# Patient Record
Sex: Female | Born: 1975 | Hispanic: No | Marital: Married | Smoking: Never smoker
Health system: Southern US, Community
[De-identification: ages and names within clinical notes are randomized; demographics above are authoritative.]

## PROBLEM LIST (undated history)

## (undated) DIAGNOSIS — G43909 Migraine, unspecified, not intractable, without status migrainosus: Secondary | ICD-10-CM

## (undated) DIAGNOSIS — E079 Disorder of thyroid, unspecified: Secondary | ICD-10-CM

## (undated) HISTORY — DX: Disorder of thyroid, unspecified: E07.9

## (undated) HISTORY — DX: Migraine, unspecified, not intractable, without status migrainosus: G43.909

---

## 2016-05-11 DIAGNOSIS — G43009 Migraine without aura, not intractable, without status migrainosus: Secondary | ICD-10-CM | POA: Insufficient documentation

## 2018-03-02 DIAGNOSIS — E039 Hypothyroidism, unspecified: Secondary | ICD-10-CM | POA: Insufficient documentation

## 2018-03-02 DIAGNOSIS — J302 Other seasonal allergic rhinitis: Secondary | ICD-10-CM | POA: Insufficient documentation

## 2018-03-02 DIAGNOSIS — J3089 Other allergic rhinitis: Secondary | ICD-10-CM

## 2018-06-23 DIAGNOSIS — G43909 Migraine, unspecified, not intractable, without status migrainosus: Secondary | ICD-10-CM | POA: Insufficient documentation

## 2019-01-10 ENCOUNTER — Ambulatory Visit (INDEPENDENT_AMBULATORY_CARE_PROVIDER_SITE_OTHER): Payer: 59

## 2019-01-10 ENCOUNTER — Ambulatory Visit (INDEPENDENT_AMBULATORY_CARE_PROVIDER_SITE_OTHER): Payer: 59 | Admitting: Orthopaedic Surgery

## 2019-01-10 DIAGNOSIS — M25511 Pain in right shoulder: Secondary | ICD-10-CM

## 2019-01-10 DIAGNOSIS — M25521 Pain in right elbow: Secondary | ICD-10-CM | POA: Diagnosis not present

## 2019-01-10 MED ORDER — MELOXICAM 7.5 MG PO TABS: 7.5000 mg | ORAL_TABLET | Freq: Every day | ORAL | 2 refills | Status: DC | PRN

## 2019-01-10 MED ORDER — METHYLPREDNISOLONE 4 MG PO TBPK: ORAL_TABLET | ORAL | 0 refills | Status: DC

## 2019-01-10 MED FILL — METHYLPREDNISOLONE 4 MG TAB: 4 | 6 days supply | Qty: 21 | Fill #0

## 2019-01-10 MED FILL — MELOXICAM 7.5 MG TABLET: 7.5 | 30 days supply | Qty: 30 | Fill #0

## 2019-01-10 NOTE — Progress Notes (Signed)
Office Visit Note   Patient: Kristina Sherman           Date of Birth: 04/05/1976           MRN: [id number] Visit Date: 01/10/2019              Requested by: No referring provider defined for this encounter. PCP: System, Pcp Not In   Assessment & Plan: Visit Diagnoses:  1. Pain in right elbow   2. Trigger point of shoulder region, right     Plan: Impression is right scapular trigger point and overuse of the right arm from bowling.  She may have some tendinitis or arthritis from her recent bowling.  She has taken ibuprofen but this gives her headache.  I have given her a prescription for a Medrol Dosepak and meloxicam to try.  I demonstrated exercises for the scapular trigger point.  I also gave her a sample of Flector patches.  Questions encouraged and answered.  Follow-Up Instructions: Return if symptoms worsen or fail to improve.   Orders:  Orders Placed This Encounter  Procedures  . XR Elbow Complete Right (3+View)   Meds ordered this encounter  Medications  . methylPREDNISolone (MEDROL DOSEPAK) 4 MG TBPK tablet    Sig: Take as directed    Dispense:  21 tablet    Refill:  0  . meloxicam (MOBIC) 7.5 MG tablet    Sig: Take 1 tablet (7.5 mg total) by mouth daily as needed for pain.    Dispense:  30 tablet    Refill:  2      Procedures: No procedures performed   Clinical Data: No additional findings.   Subjective: Chief Complaint  Patient presents with  . Right Hand - Pain    Patient is a very pleasant 43 year old female comes in with 10 to 12 days of nonspecific right elbow pain and right posterior shoulder pain.  She denies any injuries she states that she did do about 2 hours of bowling recently which may have caused this.  She denies any weakness or numbness but she does endorse some occasional tingling.  She denies any history of gout or injuries.  Denies any neck pain.   Review of Systems  Constitutional: Negative.   HENT: Negative.   Eyes: Negative.     Respiratory: Negative.   Cardiovascular: Negative.   Endocrine: Negative.   Musculoskeletal: Negative.   Neurological: Negative.   Hematological: Negative.   Psychiatric/Behavioral: Negative.   All other systems reviewed and are negative.    Objective: Vital Signs: There were no vitals taken for this visit.  Physical Exam Vitals signs and nursing note reviewed.  Constitutional:      Appearance: She is well-developed.  HENT:     Head: Normocephalic and atraumatic.  Neck:     Musculoskeletal: Neck supple.  Pulmonary:     Effort: Pulmonary effort is normal.  Abdominal:     Palpations: Abdomen is soft.  Skin:    General: Skin is warm.     Capillary Refill: Capillary refill takes less than 2 seconds.  Neurological:     Mental Status: She is alert and oriented to person, place, and time.  Psychiatric:        Behavior: Behavior normal.        Thought Content: Thought content normal.        Judgment: Judgment normal.     Ortho Exam Right shoulder exam shows tenderness along the rhomboids consistent with a scapular trigger point.  Right elbow exam shows full range of motion without significant pain with supination pronation.  She has some mild discomfort with elbow flexion and extension.  She has no point tenderness to palpation of the lateral or medial epicondyles. Specialty Comments:  No specialty comments available.  Imaging: Xr Elbow Complete Right (3+view)  Result Date: 01/10/2019 No acute or structural abnormalities.    PMFS History: There are no active problems to display for this patient.  No past medical history on file.  No family history on file.   Social History   Occupational History  . Not on file  Tobacco Use  . Smoking status: Not on file  Substance and Sexual Activity  . Alcohol use: Not on file  . Drug use: Not on file  . Sexual activity: Not on file

## 2019-01-11 ENCOUNTER — Ambulatory Visit: Payer: Self-pay | Admitting: Family Medicine

## 2019-01-13 ENCOUNTER — Ambulatory Visit: Payer: Self-pay | Admitting: Family Medicine

## 2019-01-18 ENCOUNTER — Encounter: Payer: Self-pay | Admitting: Neurology

## 2019-01-18 ENCOUNTER — Encounter: Payer: Self-pay | Admitting: Family Medicine

## 2019-01-18 ENCOUNTER — Ambulatory Visit (INDEPENDENT_AMBULATORY_CARE_PROVIDER_SITE_OTHER): Payer: 59 | Admitting: Family Medicine

## 2019-01-18 ENCOUNTER — Other Ambulatory Visit: Payer: Self-pay | Admitting: Family Medicine

## 2019-01-18 VITALS — BP 110/78 | HR 73 | Temp 98.1°F | Ht 62.0 in | Wt 121.6 lb

## 2019-01-18 DIAGNOSIS — E039 Hypothyroidism, unspecified: Secondary | ICD-10-CM | POA: Diagnosis not present

## 2019-01-18 DIAGNOSIS — Z124 Encounter for screening for malignant neoplasm of cervix: Secondary | ICD-10-CM

## 2019-01-18 DIAGNOSIS — G43709 Chronic migraine without aura, not intractable, without status migrainosus: Secondary | ICD-10-CM | POA: Diagnosis not present

## 2019-01-18 DIAGNOSIS — Z0000 Encounter for general adult medical examination without abnormal findings: Secondary | ICD-10-CM | POA: Diagnosis not present

## 2019-01-18 DIAGNOSIS — Z7689 Persons encountering health services in other specified circumstances: Secondary | ICD-10-CM | POA: Diagnosis not present

## 2019-01-18 DIAGNOSIS — E559 Vitamin D deficiency, unspecified: Secondary | ICD-10-CM

## 2019-01-18 DIAGNOSIS — Z1239 Encounter for other screening for malignant neoplasm of breast: Secondary | ICD-10-CM | POA: Diagnosis not present

## 2019-01-18 LAB — LIPID PANEL
CHOLESTEROL: 148 mg/dL (ref 0–200)
HDL: 54.5 mg/dL (ref >39.00)
LDL CALC: 83 mg/dL (ref 0–99)
NonHDL: 93.71
Total CHOL/HDL Ratio: 3
Triglycerides: 55 mg/dL (ref 0.0–149.0)
VLDL: 11 mg/dL (ref 0.0–40.0)

## 2019-01-18 LAB — BASIC METABOLIC PANEL
BUN: 15 mg/dL (ref 6–23)
CO2: 29 mEq/L (ref 19–32)
Calcium: 9.6 mg/dL (ref 8.4–10.5)
Chloride: 104 mEq/L (ref 96–112)
Creatinine, Ser: 0.81 mg/dL (ref 0.40–1.20)
GFR: 78.12 mL/min (ref >60.00)
Glucose, Bld: 82 mg/dL (ref 70–99)
Potassium: 5.3 mEq/L — ABNORMAL HIGH (ref 3.5–5.1)
Sodium: 141 mEq/L (ref 135–145)

## 2019-01-18 LAB — TSH: TSH: 2.91 u[IU]/mL (ref 0.35–4.50)

## 2019-01-18 LAB — AST: AST: 18 U/L (ref 0–37)

## 2019-01-18 LAB — VITAMIN D 25 HYDROXY (VIT D DEFICIENCY, FRACTURES): VITD: 12.75 ng/mL — ABNORMAL LOW (ref 30.00–100.00)

## 2019-01-18 LAB — ALT: ALT: 8 U/L (ref 0–35)

## 2019-01-18 LAB — T4, FREE: Free T4: 1.18 ng/dL (ref 0.60–1.60)

## 2019-01-18 MED ORDER — VITAMIN D (ERGOCALCIFEROL) 1.25 MG (50000 UNIT) PO CAPS: 50000.0000 [IU] | ORAL_CAPSULE | ORAL | 3 refills | Status: DC

## 2019-01-18 NOTE — Progress Notes (Signed)
Kristina Sherman is a 43 y.o. female  Chief Complaint  Patient presents with  . Establish Care    est care/ thyroid check, migraines 2 years/ pap 2018    HPI: Kristina Sherman is a 43 y.o. female here to establish care with our office. Her previous PCP was Dr. Burnett KanarisVirginia Sherman. She has a h/o migraines as well as hypothyroidism.  She is due for CPE and labs. She is not fasting - had tea, small bread with butter - but would like to do labs today.  She requests referrals to establish with GYN and Neuro.  Specialists: GYN (Dr. Evaristo BuryBarbara Eisenberg) - last OV 08/2018, neurology (Dr. Dalbert Batmanana Redmond) - last OV 07/2018, ortho (Dr. Roda ShuttersXu)  Last CPE, labs: 05/2017?  Last PAP: 12/2016 (normal PAP, HPV negative) - due in 2023 Last mammo: due Last Dexa: n/a Last colonoscopy: n/a  Med refills needed today: none  Patient Active Problem List   Diagnosis Date Noted  . Acquired hypothyroidism 03/02/2018  . Perennial allergic rhinitis with seasonal variation 03/02/2018  . Migraine without aura and without status migrainosus, not intractable 05/11/2016     History reviewed. No pertinent past medical history.  Past Surgical History:  Procedure Laterality Date  . CESAREAN SECTION      Social History   Socioeconomic History  . Marital status: Married    Spouse name: Not on file  . Number of children: Not on file  . Years of education: Not on file  . Highest education level: Not on file  Occupational History  . Not on file  Social Needs  . Financial resource strain: Not on file  . Food insecurity:    Worry: Not on file    Inability: Not on file  . Transportation needs:    Medical: Not on file    Non-medical: Not on file  Tobacco Use  . Smoking status: Never Smoker  . Smokeless tobacco: Never Used  Substance and Sexual Activity  . Alcohol use: Never    Frequency: Never  . Drug use: Never  . Sexual activity: Not on file  Lifestyle  . Physical activity:    Days per week: Not on file   Minutes per session: Not on file  . Stress: Not on file  Relationships  . Social connections:    Talks on phone: Not on file    Gets together: Not on file    Attends religious service: Not on file    Active member of club or organization: Not on file    Attends meetings of clubs or organizations: Not on file    Relationship status: Not on file  . Intimate partner violence:    Fear of current or ex partner: Not on file    Emotionally abused: Not on file    Physically abused: Not on file    Forced sexual activity: Not on file  Other Topics Concern  . Not on file  Social History Narrative  . Not on file    Family History  Problem Relation Age of Onset  . Hypertension Mother   . Diabetes Father      Immunization History  Administered Date(s) Administered  . Influenza-Unspecified 12/18/2016, 10/14/2017, 11/07/2018  . PPD Test 07/21/2017    Outpatient Encounter Medications as of 01/18/2019  Medication Sig  . levothyroxine (SYNTHROID, LEVOTHROID) 112 MCG tablet TAKE 1 TABLET BY MOUTH ONCE DAILY 6 IN THE MORNING  . meloxicam (MOBIC) 7.5 MG tablet Take 1 tablet (7.5 mg total) by mouth  daily as needed for pain.  . SUMAtriptan (IMITREX) 50 MG tablet Take 1 tablet (50 mg) at onset of headache and may repeat dose 2 hours later. Maximum 100 mg/day.  . topiramate (TOPAMAX) 50 MG tablet Take 1/2 tablet (25 mg) at bedtime x 1 week, then follow titration schedule as discussed in clinic.  . methylPREDNISolone (MEDROL DOSEPAK) 4 MG TBPK tablet Take as directed (Patient not taking: Reported on 01/18/2019)   No facility-administered encounter medications on file as of 01/18/2019.      ROS: Gen: no fever, chills  Skin: no rash, itching ENT: no ear pain, ear drainage, nasal congestion, rhinorrhea, sinus pressure, sore throat Eyes: no blurry vision, double vision Resp: no cough, wheeze,SOB CV: no CP, palpitations, LE edema,  GI: no heartburn, n/v/d/c, abd pain GU: no dysuria, urgency,  frequency, hematuria  MSK: no joint pain, myalgias, back pain Neuro: no dizziness, weakness, vertigo; + migraines Psych: no depression, anxiety, insomnia   No Known Allergies  BP 110/78   Pulse 73   Temp 98.1 F (36.7 C) (Oral)   Ht 5\' 2"  (1.575 m)   Wt 121 lb 9.6 oz (55.2 kg)   LMP 01/01/2019   SpO2 100%   BMI 22.24 kg/m   Physical Exam   A/P:  1. Encounter to establish care with new doctor  2. Screening for breast cancer - MM DIGITAL SCREENING BILATERAL; Future  3. Chronic migraine without aura without status migrainosus, not intractable - cont with current meds - Ambulatory referral to Neurology  4. Screening for cervical cancer - UTD on annual exam (08/2018) and PAP with HPV testing (12/2016) - Ambulatory referral to Gynecology  5. Annual physical exam - referral for mammo placed today, UTD on PAP - UTD on eye exam, due for dental exam - cont with healthy diet and encouraged pt to establish regular CV exercise routine - T4, free - ALT - AST - Basic metabolic panel - Lipid panel - VITAMIN D 25 Hydroxy (Vit-D Deficiency, Fractures) - next CPE in 1 year  6. Hypothyroidism, unspecified type - cont levothyroxine, pt will need refill once lab results are available/reviewed - TSH - T4, free

## 2019-01-19 ENCOUNTER — Other Ambulatory Visit: Payer: Self-pay | Admitting: Family Medicine

## 2019-01-19 ENCOUNTER — Telehealth: Payer: Self-pay | Admitting: Family Medicine

## 2019-01-19 MED ORDER — LEVOTHYROXINE SODIUM 112 MCG PO TABS: ORAL_TABLET | ORAL | 0 refills | Status: DC

## 2019-01-19 MED ORDER — SUMATRIPTAN SUCCINATE 50 MG PO TABS: ORAL_TABLET | ORAL | 0 refills | Status: DC

## 2019-01-19 NOTE — Telephone Encounter (Signed)
Attempted to contact pt; left message on voicemail [phone].

## 2019-01-19 NOTE — Telephone Encounter (Signed)
Copied from CRM [id number]. Topic: Quick Communication - Lab Results (Clinic Use ONLY) >> Jan 19, 2019  9:45 AM Prewette, Cordie Grice, LPN wrote: Called patient to inform them of 01/18/2019 lab results. When patient returns call, triage nurse may disclose results. >> Jan 19, 2019 10:03 AM Herby Abraham C wrote: Pt is returning call for results.

## 2019-01-19 NOTE — Telephone Encounter (Signed)
Copied from CRM [id number]. Topic: Quick Communication - Rx Refill/Question >> Jan 19, 2019 11:09 AM Baldo Daub L wrote: Medication:  levothyroxine (SYNTHROID, LEVOTHROID) 112 MCG tablet SUMAtriptan (IMITREX) 50 MG tablet  Has the patient contacted their pharmacy? Yes - needs refills (Agent: If no, request that the patient contact the pharmacy for the refill.) (Agent: If yes, when and what did the pharmacy advise?)  Preferred Pharmacy (with phone number or street name): [facility] - [place], [place] - [address]. [phone] (Phone) [phone] (Fax)  Agent: Please be advised that RX refills may take up to 3 business days. We ask that you follow-up with your pharmacy.

## 2019-01-19 NOTE — Telephone Encounter (Signed)
Approved per protocol.  

## 2019-01-27 MED FILL — LEVOTHYROXINE 112 MCG TAB: 112 | 90 days supply | Qty: 90 | Fill #0

## 2019-01-27 MED FILL — SUMAtriptan SUCCINATE 50 MG: 50 | 30 days supply | Qty: 10 | Fill #0

## 2019-01-27 MED FILL — VIT D2 1.25 MG (50,000 UNIT: 1.25 MG | 28 days supply | Qty: 4 | Fill #0

## 2019-02-08 ENCOUNTER — Encounter (HOSPITAL_BASED_OUTPATIENT_CLINIC_OR_DEPARTMENT_OTHER): Payer: Self-pay

## 2019-02-08 ENCOUNTER — Ambulatory Visit (HOSPITAL_BASED_OUTPATIENT_CLINIC_OR_DEPARTMENT_OTHER)
Admission: RE | Admit: 2019-02-08 | Discharge: 2019-02-08 | Disposition: A | Discharge status: Home / Self Care | Payer: 59 | Source: Ambulatory Visit | Attending: Family Medicine | Admitting: Family Medicine

## 2019-02-08 DIAGNOSIS — Z1239 Encounter for other screening for malignant neoplasm of breast: Secondary | ICD-10-CM

## 2019-02-08 DIAGNOSIS — Z1231 Encounter for screening mammogram for malignant neoplasm of breast: Secondary | ICD-10-CM | POA: Insufficient documentation

## 2019-03-02 ENCOUNTER — Ambulatory Visit: Payer: 59 | Admitting: Obstetrics & Gynecology

## 2019-03-19 NOTE — Progress Notes (Signed)
Virtual Visit via Video Note The purpose of this virtual visit is to provide medical care while limiting exposure to the novel coronavirus.    Consent was obtained for video visit:  yes Answered questions that patient had about telehealth interaction:  yes I discussed the limitations, risks, security and privacy concerns of performing an evaluation and management service by telemedicine. I also discussed with the patient that there may be a patient responsible charge related to this service. The patient expressed understanding and agreed to proceed.  Pt location: Home Physician Location: office Name of referring provider:  Overton MamCirigliano, Mary K, DO I connected with Kristina Sherman at patients initiation/request on 03/20/2019 at 10:00 AM EDT by video enabled telemedicine application and verified that I am speaking with the correct person using two identifiers. Pt MRN:  [id number] Pt DOB:  06-17-1976 Video Participants:  Kristina DykesLaxmi Gautier   History of Present Illness:  Kristina DykesLaxmi Macpherson is a 43 year old woman who presents for migraines.  History supplemented by PCP and prior neurologist's notes.  Onset:  Childhood.  More frequent since around 2015-2016 Location:  left sided from behind left eye to back of head, sometimes right side Quality:  Stabbing needle Intensity:  7/10.  She denies new headache, thunderclap headache Aura:  no Premonitory Phase:  no Postdrome:  no Associated symptoms:  When severe there is associated nausea and vomiting.  She denies associated photophobia, phonophobia, visual disturbance, unilateral numbness or weakness. Duration:  2 to 5 days. Frequency:  On average it occurs every 6 weeks.  When it occurs, she often will wake up in the middle of the night with it. Frequency of abortive medication: 2 to 5 days every 6 weeks Triggers:  Sleep deprivation, jumping, loud talking Relieving factors:  no Activity:  aggravates  Current NSAIDS:  none Current analgesics:  none Current  triptans:  Sumatriptan 50mg  (often waits 2 to 3 hours before taking) Current ergotamine:  none Current anti-emetic:  none Current muscle relaxants:  none Current anti-anxiolytic:  none Current sleep aide:  none Current Antihypertensive medications:  none Current Antidepressant medications:  none Current Anticonvulsant medications:  topiramate 50mg  twice daily Current anti-CGRP:  none Current Vitamins/Herbal/Supplements:  D Current Antihistamines/Decongestants:  none Other therapy:  none Hormone/birth control:  none Other medications:  Synthroid  Past NSAIDS:  Mobic Past analgesics:  Excedrin Past abortive triptans:  none Past abortive ergotamine:  none Past muscle relaxants:  none Past anti-emetic:  Promethazine 25mg  Past antihypertensive medications:  none Past antidepressant medications:  none Past anticonvulsant medications:  none Past anti-CGRP:  none Past vitamins/Herbal/Supplements:  none Past antihistamines/decongestants:  none Other past therapies:  none  Caffeine:  2 to 4 cups of tea daily Diet:  2-3 16 oz bottles of water daily Exercise:  yes Depression:  no; Anxiety:  no Other pain:  no Sleep hygiene:  varies Family history of headache:  no  01/18/19 LABS:  Na 141, K 5.3, Cl 104, CO2 29, glucose 82, BUN 15, Cr 0.81, ALT 8, AST 18.  Past Medical History: Past Medical History:  Diagnosis Date  . Migraines   . Thyroid disease    Past Surgical History: Past Surgical History:  Procedure Laterality Date  . CESAREAN SECTION     Medications: Outpatient Encounter Medications as of 03/20/2019  Medication Sig  . levothyroxine (SYNTHROID, LEVOTHROID) 112 MCG tablet TAKE 1 TABLET BY MOUTH ONCE DAILY 6 IN THE MORNING  . meloxicam (MOBIC) 7.5 MG tablet Take 1 tablet (7.5 mg total)  by mouth daily as needed for pain.  . methylPREDNISolone (MEDROL DOSEPAK) 4 MG TBPK tablet Take as directed (Patient not taking: Reported on 01/18/2019)  . SUMAtriptan (IMITREX) 50 MG  tablet Take 1 tablet (50 mg) at onset of headache and may repeat dose 2 hours later. Maximum 100 mg/day.  . topiramate (TOPAMAX) 50 MG tablet Take 1/2 tablet (25 mg) at bedtime x 1 week, then follow titration schedule as discussed in clinic.  . Vitamin D, Ergocalciferol, (DRISDOL) 1.25 MG (50000 UT) CAPS capsule Take 1 capsule (50,000 Units total) by mouth every 7 (seven) days.   No facility-administered encounter medications on file as of 03/20/2019.     Allergies: No Known Allergies  Family History: Family History  Problem Relation Age of Onset  . Hypertension Mother   . Diabetes Father     Social History: Social History   Socioeconomic History  . Marital status: Married    Spouse name: Not on file  . Number of children: Not on file  . Years of education: Not on file  . Highest education level: Not on file  Occupational History  . Not on file  Social Needs  . Financial resource strain: Not on file  . Food insecurity:    Worry: Not on file    Inability: Not on file  . Transportation needs:    Medical: Not on file    Non-medical: Not on file  Tobacco Use  . Smoking status: Never Smoker  . Smokeless tobacco: Never Used  Substance and Sexual Activity  . Alcohol use: Never    Frequency: Never  . Drug use: Never  . Sexual activity: Not on file  Lifestyle  . Physical activity:    Days per week: Not on file    Minutes per session: Not on file  . Stress: Not on file  Relationships  . Social connections:    Talks on phone: Not on file    Gets together: Not on file    Attends religious service: Not on file    Active member of club or organization: Not on file    Attends meetings of clubs or organizations: Not on file    Relationship status: Not on file  . Intimate partner violence:    Fear of current or ex partner: Not on file    Emotionally abused: Not on file    Physically abused: Not on file    Forced sexual activity: Not on file  Other Topics Concern  . Not on  file  Social History Narrative  . Not on file    Review Of Systems: Review of Systems  Constitutional: Negative for chills, fever and malaise/fatigue.  HENT: Negative for congestion, ear discharge, ear pain, hearing loss, nosebleeds, sinus pain, sore throat and tinnitus.   Eyes: Negative for blurred vision, photophobia, pain, discharge and redness.  Respiratory: Negative for cough, sputum production, shortness of breath and wheezing.   Cardiovascular: Negative for chest pain, palpitations and orthopnea.  Gastrointestinal: Negative for abdominal pain, constipation, diarrhea, heartburn, nausea and vomiting.  Genitourinary: Negative for dysuria.  Musculoskeletal: Negative for myalgias.  Skin: Negative for itching and rash.  Neurological: Positive for headaches. Negative for tremors, sensory change, speech change and focal weakness.  Endo/Heme/Allergies: Negative for environmental allergies. Does not bruise/bleed easily.  Psychiatric/Behavioral: Negative for depression. The patient is not nervous/anxious.     Observations/Objective:   Height  (1.575 m), weight 120 lb (54.4 kg). alert and oriented to person, place, and time. Attention  span and concentration intact, recent and remote memory intact, fund of knowledge intact.  Speech fluent and not dysarthric, language intact.  Eyes orthophoric and move in all directions.  Face symmetric.  Assessment and Plan:   Migraine without aura, without status migrainosus, intractable  1.  For preventative management, refilled topiramate 50mg  twice daily 2.  For abortive therapy, take sumatriptan 100mg  with naproxen 500mg  (especially take naproxen with sumatriptan if wake up at night with headache).  Take earliest onset of migraine 3. Take naproxen 500mg  30 minutes prior to exercise to prevent onset of headache while exercising. 4.  Limit use of pain relievers to no more than 2 days out of week to prevent risk of rebound or medication-overuse  headache. 5.  Keep headache diary 6.  Exercise, hydration, caffeine cessation, sleep hygiene, monitor for and avoid triggers 7.  Consider:  magnesium citrate 400mg  daily, riboflavin 400mg  daily, and coenzyme Q10 100mg  three times daily 8.  Follow up in 4 months  Follow Up Instructions:    -I discussed the assessment and treatment plan with the patient. The patient was provided an opportunity to ask questions and all were answered. The patient agreed with the plan and demonstrated an understanding of the instructions.   The patient was advised to call back or seek an in-person evaluation if the symptoms worsen or if the condition fails to improve as anticipated.   Cira Servant, DO

## 2019-03-20 ENCOUNTER — Encounter: Payer: Self-pay | Admitting: Neurology

## 2019-03-20 ENCOUNTER — Telehealth (INDEPENDENT_AMBULATORY_CARE_PROVIDER_SITE_OTHER): Payer: 59 | Admitting: Neurology

## 2019-03-20 ENCOUNTER — Ambulatory Visit: Payer: 59 | Admitting: Neurology

## 2019-03-20 ENCOUNTER — Other Ambulatory Visit: Payer: Self-pay

## 2019-03-20 ENCOUNTER — Encounter

## 2019-03-20 VITALS — Ht 62.0 in | Wt 120.0 lb

## 2019-03-20 DIAGNOSIS — G43019 Migraine without aura, intractable, without status migrainosus: Secondary | ICD-10-CM | POA: Diagnosis not present

## 2019-03-20 MED ORDER — TOPIRAMATE 50 MG PO TABS: 50.0000 mg | ORAL_TABLET | Freq: Two times a day (BID) | ORAL | 3 refills | Status: DC

## 2019-03-20 MED ORDER — SUMATRIPTAN SUCCINATE 100 MG PO TABS: ORAL_TABLET | ORAL | 2 refills | Status: DC

## 2019-03-20 MED ORDER — NAPROXEN 500 MG PO TABS: 500.0000 mg | ORAL_TABLET | Freq: Two times a day (BID) | ORAL | 3 refills | Status: DC | PRN

## 2019-03-20 MED FILL — SUMATRIPTAN SUCC 100 MG TAB: 100 | 30 days supply | Qty: 9 | Fill #0

## 2019-03-20 MED FILL — TOPIRAMATE 50 MG TABLET: 50 | 30 days supply | Qty: 60 | Fill #0

## 2019-03-20 MED FILL — NAPROXEN 500 MG TABLET: 500 | 10 days supply | Qty: 20 | Fill #0

## 2019-03-20 NOTE — Patient Instructions (Signed)
1.  refilled topiramate 50mg  twice daily 2.  when you get a migraine, take sumatriptan 100mg  with naproxen 500mg  (especially take naproxen with sumatriptan if wake up at night with headache).  Take earliest onset of migraine 3. Take naproxen 500mg  30 minutes prior to exercise to prevent onset of headache while exercising. 4.  Limit use of pain relievers to no more than 2 days out of week to prevent risk of rebound or medication-overuse headache. 5.  Keep headache diary 6.  Exercise, hydration, caffeine cessation, sleep hygiene, monitor for and avoid triggers 7.  Consider:  magnesium citrate 400mg  daily, riboflavin 400mg  daily, and coenzyme Q10 100mg  three times daily 8.  Follow up in 4 months

## 2019-03-20 NOTE — Addendum Note (Signed)
Addended byEverlena Cooper, Jeramia Saleeby R on: 03/20/2019 10:43 AM   Modules accepted: Orders

## 2019-03-22 ENCOUNTER — Ambulatory Visit: Payer: 59 | Admitting: Obstetrics & Gynecology

## 2019-04-03 ENCOUNTER — Ambulatory Visit: Payer: 59 | Admitting: Neurology

## 2019-04-03 ENCOUNTER — Ambulatory Visit: Payer: 59 | Admitting: Obstetrics & Gynecology

## 2019-04-14 ENCOUNTER — Other Ambulatory Visit: Payer: Self-pay

## 2019-04-17 ENCOUNTER — Ambulatory Visit: Payer: 59 | Admitting: Obstetrics & Gynecology

## 2019-05-04 ENCOUNTER — Other Ambulatory Visit: Payer: Self-pay | Admitting: Family Medicine

## 2019-05-04 MED ORDER — SUMATRIPTAN SUCCINATE 100 MG PO TABS: ORAL_TABLET | ORAL | 2 refills | Status: DC

## 2019-05-04 MED ORDER — LEVOTHYROXINE SODIUM 112 MCG PO TABS: ORAL_TABLET | ORAL | 2 refills | Status: DC

## 2019-05-04 MED FILL — SUMAtriptan SUCCINATE 100 M: 100 | 30 days supply | Qty: 9 | Fill #0

## 2019-05-04 MED FILL — LEVOTHYROXINE 112 MCG TAB: 112 | 90 days supply | Qty: 90 | Fill #0

## 2019-05-04 NOTE — Telephone Encounter (Signed)
Dr. Salena Saner please advise is it okay to refill imitrex?

## 2019-05-04 NOTE — Telephone Encounter (Signed)
Copied from CRM [id number]. Topic: Quick Communication - Rx Refill/Question >> May 04, 2019  9:58 AM Tamela Oddi wrote: Medication: levothyroxine (SYNTHROID, LEVOTHROID) 112 MCG tablet / SUMAtriptan (IMITREX) 100 MG tablet  Patient called to request a refill for the above medication  Preferred Pharmacy (with phone number or street name): [facility] - [place], [place] - [address] [place]. [phone] (Phone) [phone] (Fax)

## 2019-07-19 NOTE — Progress Notes (Signed)
Virtual Visit via Video Note The purpose of this virtual visit is to provide medical care while limiting exposure to the novel coronavirus.    Consent was obtained for video visit:  Yes.   Answered questions that patient had about telehealth interaction:  Yes.   I discussed the limitations, risks, security and privacy concerns of performing an evaluation and management service by telemedicine. I also discussed with the patient that there may be a patient responsible charge related to this service. The patient expressed understanding and agreed to proceed.  Pt location: Home Physician Location: Home Name of referring provider:  [place] MamCirigliano, Kristina K, DO I connected with Kristina Sherman at patients initiation/request on 07/20/2019 at 10:10 AM EDT by video enabled telemedicine application and verified that I am speaking with the correct person using two identifiers. Pt MRN:  [id number] Pt DOB:  1976/07/15 Video Participants:  Kristina Sherman   History of Present Illness:  Kristina DykesLaxmi Sherman is a 43 year old woman who follows up for migraines.  UPDATE: Overall well.  She had increased frequency of headaches in June due to a guest staying over, which affected her sleep hygiene.  Since June, she has had one migraine. Intensity:  7/10 Duration:  Within an hour Frequency:  Since June, only one migraine (aggravated by sleep deprivation) Frequency of abortive medication: once since June Rescue protocol:  Sumatriptan 100mg  with naproxen 500mg ; naproxen 500mg  30 minutes prior to exercise to prevent onset of exercise-induced migraine. Current NSAIDS:  naproxen 500mg  Current analgesics:  none Current triptans:  Sumatriptan 100mg  Current ergotamine:  none Current anti-emetic:  none Current muscle relaxants:  none Current anti-anxiolytic:  none Current sleep aide:  none Current Antihypertensive medications:  none Current Antidepressant medications:  none Current Anticonvulsant medications:  topiramate  50mg  twice daily Current anti-CGRP:  none Current Vitamins/Herbal/Supplements:  D Current Antihistamines/Decongestants:  none Other therapy:  none Hormone/birth control:  none Other medications:  Synthroid  Caffeine:  2 to 4 cups of tea daily Diet:  2-3 16 oz bottles of water daily Exercise:  yes Depression:  no; Anxiety:  no Other pain:  no Sleep hygiene:  varies  HISTORY: Onset: Since childhood.  More frequent since around 2015-2016 Location:  left sided from behind left eye to back of head, sometimes right side Quality:  Stabbing needle Initial intensity:  7/10.  She denies new headache, thunderclap headache Aura:  no Premonitory Phase:  no Postdrome:  no Associated symptoms:  When severe there is associated nausea and vomiting.  She denies associated photophobia, phonophobia, visual disturbance, unilateral numbness or weakness. Initial duration:  2 to 5 days. Initial Frequency:  On average it occurs every 6 weeks.  When it occurs, she often will wake up in the middle of the night with it. Initial Frequency of abortive medication: 2 to 5 days every 6 weeks Triggers: Sleep deprivation, jumping, loud talking Relieving factors: None Activity:  aggravates  Past NSAIDS:  Mobic Past analgesics:  Excedrin Past abortive triptans:  none Past abortive ergotamine:  none Past muscle relaxants:  none Past anti-emetic:  Promethazine 25mg  Past antihypertensive medications:  none Past antidepressant medications:  none Past anticonvulsant medications:  none Past anti-CGRP:  none Past vitamins/Herbal/Supplements:  none Past antihistamines/decongestants:  none Other past therapies:  none  Family history of headache:  no  Past Medical History: Past Medical History:  Diagnosis Date   Migraines    Thyroid disease     Medications: Outpatient Encounter Medications as of 07/20/2019  Medication Sig   levothyroxine (SYNTHROID) 112 MCG tablet TAKE 1 TABLET BY MOUTH ONCE DAILY 6 IN  THE MORNING   meloxicam (MOBIC) 7.5 MG tablet Take 1 tablet (7.5 mg total) by mouth daily as needed for pain. (Patient not taking: Reported on 03/20/2019)   methylPREDNISolone (MEDROL DOSEPAK) 4 MG TBPK tablet Take as directed (Patient not taking: Reported on 01/18/2019)   naproxen (NAPROSYN) 500 MG tablet Take 1 tablet (500 mg total) by mouth every 12 (twelve) hours as needed.   SUMAtriptan (IMITREX) 100 MG tablet Take 1 tab earliest onset of migraine.  May repeat in 2 hours if headache persists or recurs. Max 2 tabs/24 hrs   topiramate (TOPAMAX) 50 MG tablet Take 1 tablet (50 mg total) by mouth 2 (two) times daily.   Vitamin D, Ergocalciferol, (DRISDOL) 1.25 MG (50000 UT) CAPS capsule Take 1 capsule (50,000 Units total) by mouth every 7 (seven) days.   No facility-administered encounter medications on file as of 07/20/2019.     Allergies: No Known Allergies  Family History: Family History  Problem Relation Age of Onset   Hypertension Mother    Diabetes Father     Social History: Social History   Socioeconomic History   Marital status: Married    Spouse name: Not on file   Number of children: Not on file   Years of education: Not on file   Highest education level: Not on file  Occupational History   Not on file  Social Needs   Financial resource strain: Not on file   Food insecurity    Worry: Not on file    Inability: Not on file   Transportation needs    Medical: Not on file    Non-medical: Not on file  Tobacco Use   Smoking status: Never Smoker   Smokeless tobacco: Never Used  Substance and Sexual Activity   Alcohol use: Never    Frequency: Never   Drug use: Never   Sexual activity: Not on file  Lifestyle   Physical activity    Days per week: Not on file    Minutes per session: Not on file   Stress: Not on file  Relationships   Social connections    Talks on phone: Not on file    Gets together: Not on file    Attends religious service:  Not on file    Active member of club or organization: Not on file    Attends meetings of clubs or organizations: Not on file    Relationship status: Not on file   Intimate partner violence    Fear of current or ex partner: Not on file    Emotionally abused: Not on file    Physically abused: Not on file    Forced sexual activity: Not on file  Other Topics Concern   Not on file  Social History Narrative   Not on file    Observations/Objective:   Height 5\' 2"  (1.575 m), weight 117 lb (53.1 kg). No acute distress.  Alert and oriented.  Speech fluent and not dysarthric.  Language intact.  Eyes orthophoric on primary gaze.  Face symmetric.  Assessment and Plan:   Migraine without aura, without status migrainosus, not intractable.  1.  For preventative management, topiramate 50mg  twice daily 2.  For abortive therapy, sumatriptan 100mg  (w/wo naproxen).  Also naproxen 500mg  taken 30 minutes prior to exercise. 3.  Limit use of pain relievers to no more than 2 days out of week to prevent  risk of rebound or medication-overuse headache. 4.  Keep headache diary 5.  Exercise, hydration, caffeine cessation, sleep hygiene, monitor for and avoid triggers 6.  Consider:  magnesium citrate 400mg  daily, riboflavin 400mg  daily, and coenzyme Q10 100mg  three times daily 7. Always keep in mind that currently taking a hormone or birth control may be a possible trigger or aggravating factor for migraine. 8. Follow up 6 months.  Follow Up Instructions:    -I discussed the assessment and treatment plan with the patient. The patient was provided an opportunity to ask questions and all were answered. The patient agreed with the plan and demonstrated an understanding of the instructions.   The patient was advised to call back or seek an in-person evaluation if the symptoms worsen or if the condition fails to improve as anticipated.    Cira ServantAdam Robert Chevi Lim, DO

## 2019-07-20 ENCOUNTER — Encounter: Payer: Self-pay | Admitting: Neurology

## 2019-07-20 ENCOUNTER — Other Ambulatory Visit: Payer: Self-pay

## 2019-07-20 ENCOUNTER — Telehealth (INDEPENDENT_AMBULATORY_CARE_PROVIDER_SITE_OTHER): Payer: 59 | Admitting: Neurology

## 2019-07-20 VITALS — Ht 62.0 in | Wt 117.0 lb

## 2019-07-20 DIAGNOSIS — G43009 Migraine without aura, not intractable, without status migrainosus: Secondary | ICD-10-CM

## 2019-07-20 MED ORDER — SUMATRIPTAN SUCCINATE 100 MG PO TABS: ORAL_TABLET | ORAL | 5 refills | Status: DC

## 2019-07-20 MED FILL — SUMAtriptan SUCCINATE 100 M: 100 | 30 days supply | Qty: 9 | Fill #0

## 2019-08-04 MED FILL — LEVOTHYROXINE 112 MCG TAB: 112 | 90 days supply | Qty: 90 | Fill #1

## 2019-10-04 MED FILL — SUMAtriptan SUCCINATE 100 M: 100 | 30 days supply | Qty: 9 | Fill #1

## 2019-11-09 MED FILL — LEVOTHYROXINE 112 MCG TAB: 112 | 90 days supply | Qty: 90 | Fill #2

## 2019-11-22 MED FILL — SUMAtriptan SUCCINATE 100 M: 100 | 30 days supply | Qty: 9 | Fill #2

## 2019-12-28 MED FILL — SUMAtriptan SUCCINATE 100 M: 100 | 30 days supply | Qty: 9 | Fill #3

## 2020-01-18 NOTE — Progress Notes (Signed)
Virtual Visit via Video Note The purpose of this virtual visit is to provide medical care while limiting exposure to the novel coronavirus.    Consent was obtained for video visit:  Yes.   Answered questions that patient had about telehealth interaction:  Yes.   I discussed the limitations, risks, security and privacy concerns of performing an evaluation and management service by telemedicine. I also discussed with the patient that there may be a patient responsible charge related to this service. The patient expressed understanding and agreed to proceed.  Pt location: Home Physician Location: office Name of referring provider:  Ronnald Nian, DO I connected with Kristina Sherman at patients initiation/request on 01/22/2020 at 10:50 AM EST by video enabled telemedicine application and verified that I am speaking with the correct person using two identifiers. Pt MRN:  [id number] Pt DOB:  [birth year] Video Participants:  Kristina Sherman   History of Present Illness:  Kristina Sherman is a 44 year old woman who follows up for migraines.  UPDATE: Headache frequency has been fluctuating.  A couple of days ago, she had a migraine that caused her to vomit.  She stopped topiramate 3 days ago because it causes dizziness and memory problems. Intensity:  7/10 Duration:  Within an hour Frequency:  7-8 in past 30 days Rescue protocol:  Sumatriptan 100mg  Current NSAIDS:none Current analgesics:none Current triptans:Sumatriptan 100mg  Current ergotamine:none Current anti-emetic:none Current muscle relaxants:none Current anti-anxiolytic:none Current sleep aide:none Current Antihypertensive medications:none Current Antidepressant medications:none Current Anticonvulsant medications:none Current anti-CGRP:none Current Vitamins/Herbal/Supplements:D Current Antihistamines/Decongestants:none Other therapy:none Hormone/birth control:none Other  medications:Synthroid  Caffeine:2 to 4 cups of tea daily Diet:2-3 16 oz bottles of water daily Exercise:yes Depression:no; Anxiety:no Other pain:no Sleep hygiene:varies  HISTORY: Onset: Since childhood. More frequent since around 2015-2016 Location:left sided from behind left eye to back of head, sometimes right side Quality:Stabbing needle Initial intensity:7/10.Shedenies new headache, thunderclap headache Aura:no Premonitory Phase:no Postdrome:no Associated symptoms:When severe there is associated nausea and vomiting.Shedenies associated photophobia, phonophobia, visual disturbance,unilateral numbness or weakness. Initial duration:2 to 5 days. Initial Frequency:On average it occurs every 6 weeks. When it occurs, she often will wake up in the middle of the night with it. Initial Frequency of abortive medication:2 to 5 days every 6 weeks Triggers: Sleep deprivation, jumping, loud talking Relieving factors: None Activity:aggravates  Past NSAIDS:Mobic Past analgesics:Excedrin Past abortive triptans:none Past abortive ergotamine:none Past muscle relaxants:none Past anti-emetic:Promethazine 25mg  Past antihypertensive medications:none Past antidepressant medications:none Past anticonvulsant medications:  topiramate 50mg  twice daily (dizziness, memory problems) Past anti-CGRP:none Past vitamins/Herbal/Supplements:none Past antihistamines/decongestants:none Other past therapies:none  Family history of headache:no  Past Medical History: Past Medical History:  Diagnosis Date  . Migraines   . Thyroid disease     Medications: Outpatient Encounter Medications as of 01/22/2020  Medication Sig  . levothyroxine (SYNTHROID) 112 MCG tablet TAKE 1 TABLET BY MOUTH ONCE DAILY 6 IN THE MORNING  . meloxicam (MOBIC) 7.5 MG tablet Take 1 tablet (7.5 mg total) by mouth daily as needed for pain.  . naproxen  (NAPROSYN) 500 MG tablet Take 1 tablet (500 mg total) by mouth every 12 (twelve) hours as needed.  . SUMAtriptan (IMITREX) 100 MG tablet Take 1 tab earliest onset of migraine.  May repeat in 2 hours if headache persists or recurs. Max 2 tabs/24 hrs  . topiramate (TOPAMAX) 50 MG tablet Take 1 tablet (50 mg total) by mouth 2 (two) times daily.  . Vitamin D, Ergocalciferol, (DRISDOL) 1.25 MG (50000 UT) CAPS capsule Take 1 capsule (  50,000 Units total) by mouth every 7 (seven) days.   No facility-administered encounter medications on file as of 01/22/2020.    Allergies: No Known Allergies  Family History: Family History  Problem Relation Age of Onset  . Hypertension Mother   . Diabetes Father     Social History: Social History   Socioeconomic History  . Marital status: Married    Spouse name: Not on file  . Number of children: 2  . Years of education: Not on file  . Highest education level: Not on file  Occupational History  . Occupation: uemployed  Tobacco Use  . Smoking status: Never Smoker  . Smokeless tobacco: Never Used  Substance and Sexual Activity  . Alcohol use: Never  . Drug use: Never  . Sexual activity: Not on file  Other Topics Concern  . Not on file  Social History Narrative   Patient is right-handed. Patient lives with her husband and 2 children in 2 story home. She drinks 3 cups of tea a day. She exercise daily, yoga and walking.   Social Determinants of Health   Financial Resource Strain:   . Difficulty of Paying Living Expenses: Not on file  Food Insecurity:   . Worried About Programme researcher, broadcasting/film/video in the Last Year: Not on file  . Ran Out of Food in the Last Year: Not on file  Transportation Needs:   . Lack of Transportation (Medical): Not on file  . Lack of Transportation (Non-Medical): Not on file  Physical Activity:   . Days of Exercise per Week: Not on file  . Minutes of Exercise per Session: Not on file  Stress:   . Feeling of Stress : Not on file   Social Connections:   . Frequency of Communication with Friends and Family: Not on file  . Frequency of Social Gatherings with Friends and Family: Not on file  . Attends Religious Services: Not on file  . Active Member of Clubs or Organizations: Not on file  . Attends Banker Meetings: Not on file  . Marital Status: Not on file  Intimate Partner Violence:   . Fear of Current or Ex-Partner: Not on file  . Emotionally Abused: Not on file  . Physically Abused: Not on file  . Sexually Abused: Not on file    Observations/Objective:   Height 5\' 2"  (1.575 m), weight 118 lb (53.5 kg). No acute distress.  Alert and oriented.  Speech fluent and not dysarthric.  Language intact.  Eyes orthophoric on primary gaze.  Face symmetric.  Assessment and Plan:   Migraine without aura, without status migrainosus, not intractable  1.  For preventative management, start nortriptyline 10mg  at bedtime.  We can increase to 25mg  at bedtime in 4 weeks if needed. 2.  For abortive therapy, sumatriptan 3.  Limit use of pain relievers to no more than 2 days out of week to prevent risk of rebound or medication-overuse headache. 4.  Keep headache diary 5.  Exercise, hydration, caffeine cessation, sleep hygiene, monitor for and avoid triggers 6. Follow up 4 months   Follow Up Instructions:    -I discussed the assessment and treatment plan with the patient. The patient was provided an opportunity to ask questions and all were answered. The patient agreed with the plan and demonstrated an understanding of the instructions.   The patient was advised to call back or seek an in-person evaluation if the symptoms worsen or if the condition fails to improve as anticipated.  Dudley Major, DO

## 2020-01-22 ENCOUNTER — Encounter: Payer: Self-pay | Admitting: Neurology

## 2020-01-22 ENCOUNTER — Other Ambulatory Visit: Payer: Self-pay

## 2020-01-22 ENCOUNTER — Telehealth (INDEPENDENT_AMBULATORY_CARE_PROVIDER_SITE_OTHER): Payer: 59 | Admitting: Neurology

## 2020-01-22 VITALS — Ht 62.0 in | Wt 118.0 lb

## 2020-01-22 DIAGNOSIS — G43009 Migraine without aura, not intractable, without status migrainosus: Secondary | ICD-10-CM

## 2020-01-22 MED ORDER — NORTRIPTYLINE HCL 10 MG PO CAPS: 10.0000 mg | ORAL_CAPSULE | Freq: Every day | ORAL | 3 refills | Status: DC

## 2020-01-22 MED FILL — NORTRIPTYLINE HCL 10 MG CAP: 10 | 30 days supply | Qty: 30 | Fill #0

## 2020-02-09 ENCOUNTER — Telehealth: Payer: Self-pay | Admitting: Family Medicine

## 2020-02-09 ENCOUNTER — Other Ambulatory Visit: Payer: Self-pay | Admitting: Family Medicine

## 2020-02-09 MED FILL — LEVOTHYROXINE SODIUM 112 MC: 112 | 90 days supply | Qty: 90 | Fill #0

## 2020-02-09 NOTE — Telephone Encounter (Addendum)
Patient is calling and requesting a refill for Levothyroxine sent to [facility]. CB is [phone]

## 2020-02-09 NOTE — Telephone Encounter (Signed)
Last OV 01/18/19 Last fill 05/04/19  #90/2 Next OV 03/06/20 

## 2020-02-09 NOTE — Telephone Encounter (Signed)
Last OV 01/18/19 Last fill 05/04/19  #90/2 Next OV 03/06/20

## 2020-02-09 NOTE — Telephone Encounter (Signed)
Rx sent 

## 2020-02-11 ENCOUNTER — Ambulatory Visit: Payer: 59 | Attending: Internal Medicine

## 2020-02-11 DIAGNOSIS — Z23 Encounter for immunization: Secondary | ICD-10-CM | POA: Insufficient documentation

## 2020-02-11 NOTE — Progress Notes (Signed)
   Covid-19 Vaccination Clinic  Name:  Kristina Sherman    MRN: [id number] DOB: 08/04/1976  02/11/2020  [name] was observed post Covid-19 immunization for 15 minutes without incident. She was provided with Vaccine Information Sheet and instruction to access the V-Safe system.   [name] was instructed to call 911 with any severe reactions post vaccine: Marland Kitchen Difficulty breathing  . Swelling of face and throat  . A fast heartbeat  . A bad rash all over body  . Dizziness and weakness   Immunizations Administered    Name Date Dose VIS Date Route   Pfizer COVID-19 Vaccine 02/11/2020  3:07 PM 0.3 mL 11/17/2019 Intramuscular   Manufacturer: [employer], [employer]   Lot: [id number]   NDC: [address]-2

## 2020-02-26 MED FILL — NORTRIPTYLINE HCL 10 MG CAP: 10 | 30 days supply | Qty: 30 | Fill #1

## 2020-02-26 MED FILL — SUMAtriptan SUCCINATE 100 M: 100 | 30 days supply | Qty: 9 | Fill #4

## 2020-03-04 ENCOUNTER — Ambulatory Visit: Payer: 59 | Attending: Internal Medicine

## 2020-03-04 DIAGNOSIS — Z23 Encounter for immunization: Secondary | ICD-10-CM

## 2020-03-04 NOTE — Progress Notes (Signed)
   Covid-19 Vaccination Clinic  Name:  Kristina Sherman    MRN: [id number] DOB: 1976-10-17  03/04/2020  Kristina Sherman was observed post Covid-19 immunization for 15 minutes without incident. She was provided with Vaccine Information Sheet and instruction to access the V-Safe system.   Kristina Sherman was instructed to call 911 with any severe reactions post vaccine: Marland Kitchen Difficulty breathing  . Swelling of face and throat  . A fast heartbeat  . A bad rash all over body  . Dizziness and weakness   Immunizations Administered    Name Date Dose VIS Date Route   Pfizer COVID-19 Vaccine 03/04/2020  9:04 AM 0.3 mL 11/17/2019 Intramuscular   Manufacturer: [employer], [employer]   Lot: [id number]   NDC: [address]-7

## 2020-03-06 ENCOUNTER — Encounter: Payer: 59 | Admitting: Family Medicine

## 2020-03-11 ENCOUNTER — Encounter: Payer: 59 | Admitting: Obstetrics & Gynecology

## 2020-03-11 NOTE — Patient Instructions (Signed)
Health Maintenance Due  Topic Date Due  . HIV Screening  Never done  . TETANUS/TDAP  Never done  . PAP SMEAR-Modifier  Never done    Depression screen [facility] 2/9 01/18/2019  Decreased Interest 0  Down, Depressed, Hopeless 0  PHQ - 2 Score 0

## 2020-03-12 ENCOUNTER — Ambulatory Visit: Payer: 59

## 2020-03-14 ENCOUNTER — Other Ambulatory Visit: Payer: Self-pay

## 2020-03-15 ENCOUNTER — Encounter: Payer: Self-pay | Admitting: Family Medicine

## 2020-03-15 ENCOUNTER — Ambulatory Visit (INDEPENDENT_AMBULATORY_CARE_PROVIDER_SITE_OTHER): Payer: 59 | Admitting: Family Medicine

## 2020-03-15 VITALS — BP 110/60 | HR 93 | Temp 97.5°F | Ht 62.0 in | Wt 116.0 lb

## 2020-03-15 DIAGNOSIS — Z1231 Encounter for screening mammogram for malignant neoplasm of breast: Secondary | ICD-10-CM

## 2020-03-15 DIAGNOSIS — E559 Vitamin D deficiency, unspecified: Secondary | ICD-10-CM

## 2020-03-15 DIAGNOSIS — Z23 Encounter for immunization: Secondary | ICD-10-CM

## 2020-03-15 DIAGNOSIS — Z0000 Encounter for general adult medical examination without abnormal findings: Secondary | ICD-10-CM

## 2020-03-15 DIAGNOSIS — E039 Hypothyroidism, unspecified: Secondary | ICD-10-CM

## 2020-03-15 NOTE — Progress Notes (Signed)
Kristina Sherman is a 44 y.o. female  Chief Complaint  Patient presents with  . Annual Exam    CPE-pt not fasting//No Pap today-last pap 2018 not due till 2023//not sure on tetanus    HPI: Kristina Sherman is a 44 y.o. female here for CPE. She is not fasting for labs. UTD on PAP. She is due for Tdap.  Last PAP: 2018 Last mammo: 02/2019  Diet/Exercise: healthy diet Dental: due for exam Vision: appt set for end of 03/2020   Med refills needed today? no   Past Medical History:  Diagnosis Date  . Migraines   . Thyroid disease     Past Surgical History:  Procedure Laterality Date  . CESAREAN SECTION      Social History   Socioeconomic History  . Marital status: Married    Spouse name: Not on file  . Number of children: 2  . Years of education: Not on file  . Highest education level: Not on file  Occupational History  . Occupation: uemployed  Tobacco Use  . Smoking status: Never Smoker  . Smokeless tobacco: Never Used  Substance and Sexual Activity  . Alcohol use: Never  . Drug use: Never  . Sexual activity: Not on file  Other Topics Concern  . Not on file  Social History Narrative   Patient is right-handed. Patient lives with her husband and 2 children in 2 story home. She drinks 3 cups of tea a day. She exercise daily, yoga and walking.   Social Determinants of Health   Financial Resource Strain:   . Difficulty of Paying Living Expenses:   Food Insecurity:   . Worried About Charity fundraiser in the Last Year:   . Arboriculturist in the Last Year:   Transportation Needs:   . Film/video editor (Medical):   Marland Kitchen Lack of Transportation (Non-Medical):   Physical Activity:   . Days of Exercise per Week:   . Minutes of Exercise per Session:   Stress:   . Feeling of Stress :   Social Connections:   . Frequency of Communication with Friends and Family:   . Frequency of Social Gatherings with Friends and Family:   . Attends Religious Services:   . Active  Member of Clubs or Organizations:   . Attends Archivist Meetings:   Marland Kitchen Marital Status:   Intimate Partner Violence:   . Fear of Current or Ex-Partner:   . Emotionally Abused:   Marland Kitchen Physically Abused:   . Sexually Abused:     Family History  Problem Relation Age of Onset  . Hypertension Mother   . Diabetes Father      Immunization History  Administered Date(s) Administered  . Influenza-Unspecified 12/18/2016, 10/14/2017, 11/07/2018  . PFIZER SARS-COV-2 Vaccination 02/11/2020, 03/04/2020  . PPD Test 07/21/2017    Outpatient Encounter Medications as of 03/15/2020  Medication Sig  . levothyroxine (SYNTHROID) 112 MCG tablet TAKE 1 TABLET BY MOUTH ONCE DAILY 6 AM IN THE MORNING  . meloxicam (MOBIC) 7.5 MG tablet Take 1 tablet (7.5 mg total) by mouth daily as needed for pain.  . naproxen (NAPROSYN) 500 MG tablet Take 1 tablet (500 mg total) by mouth every 12 (twelve) hours as needed.  . nortriptyline (PAMELOR) 10 MG capsule Take 1 capsule (10 mg total) by mouth at bedtime.  . SUMAtriptan (IMITREX) 100 MG tablet Take 1 tab earliest onset of migraine.  May repeat in 2 hours if headache persists or  recurs. Max 2 tabs/24 hrs  . Vitamin D, Ergocalciferol, (DRISDOL) 1.25 MG (50000 UT) CAPS capsule Take 1 capsule (50,000 Units total) by mouth every 7 (seven) days.   No facility-administered encounter medications on file as of 03/15/2020.     ROS: Gen: no fever, chills  Skin: no rash, itching ENT: no ear pain, ear drainage, nasal congestion, rhinorrhea, sinus pressure, sore throat Eyes: no blurry vision, double vision Resp: no cough, wheeze,SOB CV: no CP, palpitations, LE edema,  GI: no heartburn, n/v/d/c, abd pain GU: no dysuria, urgency, frequency, hematuria MSK: no joint pain, myalgias, back pain Neuro: no dizziness, headache, weakness, vertigo Psych: no depression, anxiety, insomnia   No Known Allergies  BP 110/60 (BP Location: Left Arm, Patient Position: Sitting, Cuff  Size: Normal)   Pulse 93   Temp (!) 97.5 F (36.4 C) (Tympanic)   Ht 5\' 2"  (1.575 m)   Wt 116 lb (52.6 kg)   SpO2 98%   BMI 21.22 kg/m   Physical Exam  Constitutional: She is oriented to person, place, and time. She appears well-developed and well-nourished. No distress.  HENT:  Head: Normocephalic and atraumatic.  Right Ear: Tympanic membrane and ear canal normal.  Left Ear: Tympanic membrane and ear canal normal.  Nose: Nose normal.  Mouth/Throat: Oropharynx is clear and moist and mucous membranes are normal.  Eyes: Pupils are equal, round, and reactive to light. Conjunctivae are normal.  Neck: No thyromegaly present.  Cardiovascular: Normal rate, regular rhythm, normal heart sounds and intact distal pulses.  No murmur heard. Pulmonary/Chest: Effort normal and breath sounds normal. No respiratory distress. She has no wheezes. She has no rhonchi.  Abdominal: Soft. Bowel sounds are normal. She exhibits no distension and no mass. There is no abdominal tenderness.  Musculoskeletal:        General: No edema.     Cervical back: Neck supple.  Lymphadenopathy:    She has no cervical adenopathy.  Neurological: She is alert and oriented to person, place, and time. She exhibits normal muscle tone. Coordination normal.  Skin: Skin is warm and dry.  Psychiatric: She has a normal mood and affect. Her behavior is normal.     A/P:  1. Annual physical exam - discussed importance of regular CV exercise, healthy diet, adequate sleep - due for dental and vision exams - UTD on PAP, mammo due and referral placed - ALT; Future - AST; Future - Basic metabolic panel; Future - CBC; Future - Lipid panel; Future  2. Vitamin D deficiency - VITAMIN D 25 Hydroxy (Vit-D Deficiency, Fractures); Future  3. Acquired hypothyroidism - on levothyroxine daily - TSH; Future - T4, free; Future  4. Need for diphtheria-tetanus-pertussis (Tdap) vaccine - Tdap vaccine greater than or equal to 7yo  IM  5. Encounter for screening mammogram for malignant neoplasm of breast - MM DIGITAL SCREENING BILATERAL; Future   This visit occurred during the SARS-CoV-2 public health emergency.  Safety protocols were in place, including screening questions prior to the visit, additional usage of staff PPE, and extensive cleaning of exam room while observing appropriate contact time as indicated for disinfecting solutions.

## 2020-03-19 ENCOUNTER — Other Ambulatory Visit (INDEPENDENT_AMBULATORY_CARE_PROVIDER_SITE_OTHER): Payer: 59

## 2020-03-19 DIAGNOSIS — E559 Vitamin D deficiency, unspecified: Secondary | ICD-10-CM

## 2020-03-19 DIAGNOSIS — E039 Hypothyroidism, unspecified: Secondary | ICD-10-CM

## 2020-03-19 DIAGNOSIS — Z0000 Encounter for general adult medical examination without abnormal findings: Secondary | ICD-10-CM | POA: Diagnosis not present

## 2020-03-19 LAB — BASIC METABOLIC PANEL
BUN: 10 mg/dL (ref 6–23)
CO2: 29 mEq/L (ref 19–32)
Calcium: 9.2 mg/dL (ref 8.4–10.5)
Chloride: 104 mEq/L (ref 96–112)
Creatinine, Ser: 0.81 mg/dL (ref 0.40–1.20)
GFR: 77.67 mL/min (ref >60.00)
Glucose, Bld: 89 mg/dL (ref 70–99)
Potassium: 5 mEq/L (ref 3.5–5.1)
Sodium: 136 mEq/L (ref 135–145)

## 2020-03-19 LAB — CBC
HCT: 37.4 % (ref 36.0–46.0)
Hemoglobin: 12.8 g/dL (ref 12.0–15.0)
MCHC: 34.1 g/dL (ref 30.0–36.0)
MCV: 85.9 fl (ref 78.0–100.0)
Platelets: 190 10*3/uL (ref 150.0–400.0)
RBC: 4.35 Mil/uL (ref 3.87–5.11)
RDW: 13.5 % (ref 11.5–15.5)
WBC: 4.4 10*3/uL (ref 4.0–10.5)

## 2020-03-19 LAB — LIPID PANEL
Cholesterol: 142 mg/dL (ref 0–200)
HDL: 48.3 mg/dL (ref >39.00)
LDL Cholesterol: 86 mg/dL (ref 0–99)
NonHDL: 93.42
Total CHOL/HDL Ratio: 3
Triglycerides: 37 mg/dL (ref 0.0–149.0)
VLDL: 7.4 mg/dL (ref 0.0–40.0)

## 2020-03-19 LAB — AST: AST: 24 U/L (ref 0–37)

## 2020-03-19 LAB — VITAMIN D 25 HYDROXY (VIT D DEFICIENCY, FRACTURES): VITD: 27.02 ng/mL — ABNORMAL LOW (ref 30.00–100.00)

## 2020-03-19 LAB — T4, FREE: Free T4: 1.41 ng/dL (ref 0.60–1.60)

## 2020-03-19 LAB — TSH: TSH: 0.9 u[IU]/mL (ref 0.35–4.50)

## 2020-03-19 LAB — ALT: ALT: 9 U/L (ref 0–35)

## 2020-03-20 ENCOUNTER — Encounter: Payer: Self-pay | Admitting: Family Medicine

## 2020-03-29 MED FILL — NORTRIPTYLINE HCL 10 MG CAP: 10 | 30 days supply | Qty: 30 | Fill #2

## 2020-04-04 DIAGNOSIS — H52223 Regular astigmatism, bilateral: Secondary | ICD-10-CM | POA: Diagnosis not present

## 2020-04-04 DIAGNOSIS — H5213 Myopia, bilateral: Secondary | ICD-10-CM | POA: Diagnosis not present

## 2020-04-08 ENCOUNTER — Other Ambulatory Visit: Payer: Self-pay

## 2020-04-08 ENCOUNTER — Ambulatory Visit (HOSPITAL_BASED_OUTPATIENT_CLINIC_OR_DEPARTMENT_OTHER)
Admission: RE | Admit: 2020-04-08 | Discharge: 2020-04-08 | Disposition: A | Discharge status: Home / Self Care | Payer: 59 | Source: Ambulatory Visit | Attending: Family Medicine | Admitting: Family Medicine

## 2020-04-08 ENCOUNTER — Encounter (HOSPITAL_BASED_OUTPATIENT_CLINIC_OR_DEPARTMENT_OTHER): Payer: Self-pay

## 2020-04-08 DIAGNOSIS — Z1231 Encounter for screening mammogram for malignant neoplasm of breast: Secondary | ICD-10-CM | POA: Diagnosis not present

## 2020-04-23 MED FILL — SUMAtriptan SUCCINATE 100 M: 100 | 30 days supply | Qty: 9 | Fill #5

## 2020-04-23 MED FILL — LEVOTHYROXINE SODIUM 112 MC: 112 | 90 days supply | Qty: 90 | Fill #0

## 2020-05-07 MED FILL — NORTRIPTYLINE HCL 10 MG CAP: 10 | 30 days supply | Qty: 30 | Fill #3

## 2020-05-22 ENCOUNTER — Other Ambulatory Visit: Payer: Self-pay

## 2020-05-22 ENCOUNTER — Other Ambulatory Visit (HOSPITAL_COMMUNITY)
Admission: RE | Admit: 2020-05-22 | Discharge: 2020-05-22 | Disposition: A | Discharge status: Home / Self Care | Payer: 59 | Source: Ambulatory Visit | Attending: Obstetrics & Gynecology | Admitting: Obstetrics & Gynecology

## 2020-05-22 ENCOUNTER — Ambulatory Visit (HOSPITAL_BASED_OUTPATIENT_CLINIC_OR_DEPARTMENT_OTHER): Payer: 59 | Admitting: Obstetrics & Gynecology

## 2020-05-22 ENCOUNTER — Encounter: Payer: Self-pay | Admitting: Obstetrics & Gynecology

## 2020-05-22 VITALS — BP 100/72 | HR 71 | Ht 62.0 in | Wt 116.0 lb

## 2020-05-22 DIAGNOSIS — Z01419 Encounter for gynecological examination (general) (routine) without abnormal findings: Secondary | ICD-10-CM | POA: Insufficient documentation

## 2020-05-22 DIAGNOSIS — Z975 Presence of (intrauterine) contraceptive device: Secondary | ICD-10-CM | POA: Diagnosis not present

## 2020-05-22 DIAGNOSIS — Z3009 Encounter for other general counseling and advice on contraception: Secondary | ICD-10-CM

## 2020-05-22 MED FILL — SUMAtriptan SUCCINATE 100 M: 100 | 20 days supply | Qty: 6 | Fill #6

## 2020-05-22 NOTE — Patient Instructions (Signed)
Contraception Choices Contraception, also called birth control, refers to methods or devices that prevent pregnancy. Hormonal methods Contraceptive implant  A contraceptive implant is a thin, plastic tube that contains a hormone. It is inserted into the upper part of the arm. It can remain in place for up to 3 years. Progestin-only injections Progestin-only injections are injections of progestin, a synthetic form of the hormone progesterone. They are given every 3 months by a health care provider. Birth control pills  Birth control pills are pills that contain hormones that prevent pregnancy. They must be taken once a day, preferably at the same time each day. Birth control patch  The birth control patch contains hormones that prevent pregnancy. It is placed on the skin and must be changed once a week for three weeks and removed on the fourth week. A prescription is needed to use this method of contraception. Vaginal ring  A vaginal ring contains hormones that prevent pregnancy. It is placed in the vagina for three weeks and removed on the fourth week. After that, the process is repeated with a new ring. A prescription is needed to use this method of contraception. Emergency contraceptive Emergency contraceptives prevent pregnancy after unprotected sex. They come in pill form and can be taken up to 5 days after sex. They work best the sooner they are taken after having sex. Most emergency contraceptives are available without a prescription. This method should not be used as your only form of birth control. Barrier methods Female condom  A female condom is a thin sheath that is worn over the penis during sex. Condoms keep sperm from going inside a woman's body. They can be used with a spermicide to increase their effectiveness. They should be disposed after a single use. Female condom  A female condom is a soft, loose-fitting sheath that is put into the vagina before sex. The condom keeps sperm  from going inside a woman's body. They should be disposed after a single use. Diaphragm  A diaphragm is a soft, dome-shaped barrier. It is inserted into the vagina before sex, along with a spermicide. The diaphragm blocks sperm from entering the uterus, and the spermicide kills sperm. A diaphragm should be left in the vagina for 6-8 hours after sex and removed within 24 hours. A diaphragm is prescribed and fitted by a health care provider. A diaphragm should be replaced every 1-2 years, after giving birth, after gaining more than 15 lb (6.8 kg), and after pelvic surgery. Cervical cap  A cervical cap is a round, soft latex or plastic cup that fits over the cervix. It is inserted into the vagina before sex, along with spermicide. It blocks sperm from entering the uterus. The cap should be left in place for 6-8 hours after sex and removed within 48 hours. A cervical cap must be prescribed and fitted by a health care provider. It should be replaced every 2 years. Sponge  A sponge is a soft, circular piece of polyurethane foam with spermicide on it. The sponge helps block sperm from entering the uterus, and the spermicide kills sperm. To use it, you make it wet and then insert it into the vagina. It should be inserted before sex, left in for at least 6 hours after sex, and removed and thrown away within 30 hours. Spermicides Spermicides are chemicals that kill or block sperm from entering the cervix and uterus. They can come as a cream, jelly, suppository, foam, or tablet. A spermicide should be inserted into the   vagina with an applicator at least 10-15 minutes before sex to allow time for it to work. The process must be repeated every time you have sex. Spermicides do not require a prescription. Intrauterine contraception Intrauterine device (IUD) An IUD is a T-shaped device that is put in a woman's uterus. There are two types:  Hormone IUD.This type contains progestin, a synthetic form of the hormone  progesterone. This type can stay in place for 3-5 years.  Copper IUD.This type is wrapped in copper wire. It can stay in place for 10 years.  Permanent methods of contraception Female tubal ligation In this method, a woman's fallopian tubes are sealed, tied, or blocked during surgery to prevent eggs from traveling to the uterus. Hysteroscopic sterilization In this method, a small, flexible insert is placed into each fallopian tube. The inserts cause scar tissue to form in the fallopian tubes and block them, so sperm cannot reach an egg. The procedure takes about 3 months to be effective. Another form of birth control must be used during those 3 months. Female sterilization This is a procedure to tie off the tubes that carry sperm (vasectomy). After the procedure, the man can still ejaculate fluid (semen). Natural planning methods Natural family planning In this method, a couple does not have sex on days when the woman could become pregnant. Calendar method This means keeping track of the length of each menstrual cycle, identifying the days when pregnancy can happen, and not having sex on those days. Ovulation method In this method, a couple avoids sex during ovulation. Symptothermal method This method involves not having sex during ovulation. The woman typically checks for ovulation by watching changes in her temperature and in the consistency of cervical mucus. Post-ovulation method In this method, a couple waits to have sex until after ovulation. Summary  Contraception, also called birth control, means methods or devices that prevent pregnancy.  Hormonal methods of contraception include implants, injections, pills, patches, vaginal rings, and emergency contraceptives.  Barrier methods of contraception can include female condoms, female condoms, diaphragms, cervical caps, sponges, and spermicides.  There are two types of IUDs (intrauterine devices). An IUD can be put in a woman's uterus to  prevent pregnancy for 3-5 years.  Permanent sterilization can be done through a procedure for males, females, or both.  Natural family planning methods involve not having sex on days when the woman could become pregnant. This information is not intended to replace advice given to you by your health care provider. Make sure you discuss any questions you have with your health care provider. Document Revised: 11/25/2017 Document Reviewed: 12/26/2016 Elsevier Patient Education  2020 [employer] Inc[employer]  

## 2020-05-22 NOTE — Progress Notes (Signed)
Subjective:     Kristina Sherman is a 44 y.o. female here for a routine exam. G2P2 LMP monthly with brown discharge before and after 4 day cycles. Current complaints: none   Gynecologic History No LMP recorded. (Menstrual status: IUD). Contraception: IUD Last Pap: > 3 years  Last mammogram: May. Results were: normal  Obstetric History OB History  Gravida Para Term Preterm AB Living  2 2 2     2   SAB TAB Ectopic Multiple Live Births          2    # Outcome Date GA Lbr Len/2nd Weight Sex Delivery Anes PTL Lv  2 Term 2013 [redacted]w[redacted]d   F CS-LTranv Spinal N LIV  1 Term 2007 [redacted]w[redacted]d   M CS-LTranv EPI N LIV   The following portions of the patient's history were reviewed and updated as appropriate: allergies, current medications, past family history, past medical history, past social history, past surgical history and problem list.  Review of Systems Pertinent items are noted in HPI.    Objective:  BP 100/72   Pulse 71   Ht 5\' 2"  (1.575 m)   Wt 116 lb (52.6 kg)   BMI 21.22 kg/m   General Appearance:    Alert, cooperative, no distress, appears stated age  Head:    Normocephalic, without obvious abnormality, atraumatic  Eyes:    conjunctiva/corneas clear, EOM's intact, both eyes  Ears:    Normal external ear canals, both ears  Nose:   Nares normal, septum midline, mucosa normal, no drainage    or sinus tenderness  Throat:   Lips, mucosa, and tongue normal; teeth and gums normal  Neck:   Supple, symmetrical, trachea midline, no adenopathy;    thyroid:  no enlargement/tenderness/nodules  Back:     Symmetric, no curvature, ROM normal, no CVA tenderness  Lungs:     respirations unlabored  Chest Wall:    No tenderness or deformity   Heart:    Regular rate and rhythm  Breast Exam:    No tenderness, masses, or nipple abnormality  Abdomen:     Soft, non-tender, bowel sounds active all four quadrants,    no masses, no organomegaly; well healed transverse incision  Genitalia:    Normal female  without lesion, discharge or tenderness   IUD strings noted 3 cm in length.   Extremities:   Extremities normal, atraumatic, no cyanosis or edema  Pulses:   2+ and symmetric all extremities  Skin:   Skin color, texture, turgor normal, no rashes or lesions    Assessment:    Healthy female exam.   Contraception counseling- pt has Paragrd IUD for 8 years. Reviewed options after 2 years. Pt wants Paragard replaced in 2 years.     Plan:   F/u PAP with hrHPV  F/u in 1 year for annual  replace Paragard in 2 years.   Lilyahna Sirmon L. Harraway-Smith, M.D., [redacted]w[redacted]d

## 2020-05-23 LAB — CYTOLOGY - PAP
Comment: NEGATIVE
Diagnosis: NEGATIVE
High risk HPV: NEGATIVE

## 2020-05-24 ENCOUNTER — Ambulatory Visit: Payer: 59 | Admitting: Neurology

## 2020-06-04 ENCOUNTER — Other Ambulatory Visit: Payer: Self-pay

## 2020-06-04 MED ORDER — SUMATRIPTAN SUCCINATE 100 MG PO TABS: ORAL_TABLET | ORAL | 5 refills | Status: DC

## 2020-06-04 MED ORDER — NORTRIPTYLINE HCL 10 MG PO CAPS: 10.0000 mg | ORAL_CAPSULE | Freq: Every day | ORAL | 3 refills | Status: DC

## 2020-06-04 MED FILL — NORTRIPTYLINE HCL 10 MG CAP: 10 | 30 days supply | Qty: 30 | Fill #0

## 2020-06-07 MED FILL — SUMAtriptan SUCCINATE 100 M: 100 | 30 days supply | Qty: 9 | Fill #0

## 2020-07-01 MED FILL — NORTRIPTYLINE HCL 10 MG CAP: 10 | 30 days supply | Qty: 30 | Fill #1

## 2020-07-01 MED FILL — SUMAtriptan SUCCINATE 100 M: 100 | 30 days supply | Qty: 9 | Fill #1

## 2020-08-06 MED FILL — NORTRIPTYLINE HCL 10 MG CAP: 10 | 30 days supply | Qty: 30 | Fill #2

## 2020-08-06 MED FILL — LEVOTHYROXINE SODIUM 112 MC: 112 | 90 days supply | Qty: 90 | Fill #1

## 2020-08-09 MED FILL — SUMAtriptan SUCCINATE 100 M: 100 | 30 days supply | Qty: 9 | Fill #2

## 2020-08-23 ENCOUNTER — Other Ambulatory Visit: Payer: Self-pay

## 2020-08-23 ENCOUNTER — Emergency Department (INDEPENDENT_AMBULATORY_CARE_PROVIDER_SITE_OTHER)
Admission: RE | Admit: 2020-08-23 | Discharge: 2020-08-23 | Disposition: A | Discharge status: Home / Self Care | Payer: 59 | Source: Ambulatory Visit | Attending: Family Medicine | Admitting: Family Medicine

## 2020-08-23 VITALS — BP 105/74 | HR 70 | Temp 98.0°F | Resp 15

## 2020-08-23 DIAGNOSIS — H00011 Hordeolum externum right upper eyelid: Secondary | ICD-10-CM

## 2020-08-23 MED ORDER — SULFACETAMIDE SODIUM 10 % OP SOLN: 1.0000 [drp] | OPHTHALMIC | 0 refills | Status: DC

## 2020-08-23 MED FILL — SULFACETAMIDE 10% EYE DROPS: 10 | 7 days supply | Qty: 15 | Fill #0

## 2020-08-23 NOTE — ED Triage Notes (Signed)
Right eyelid sollen & red  X 3-4 days Min relief w/ wamm moist compresses Denies any new lotion, face wash or detergents Wears glasses- no contacts - no vision changes Denies fever or chills Pfizer vaccine march 2021

## 2020-08-23 NOTE — Discharge Instructions (Signed)
Apply a warm, wet cloth (warm compress) to your eye for 5-10 minutes, 4 times a day.  If symptoms become significantly worse during the night or over the weekend, proceed to the local emergency room.

## 2020-08-23 NOTE — ED Provider Notes (Signed)
[facility]    CSN: [id number] Arrival date & time: 08/23/20  0953      History   Chief Complaint Chief Complaint  Patient presents with  . Appointment  . Facial Swelling    right eyelid    HPI Kristina Sherman is a 44 y.o. female.   Patient complains of five day history of redness, pain, and swelling in her right upper eyelid.  She denies changes in vision.  The history is provided by the patient.  Eye Problem Location:  Right eye Quality:  Aching Severity:  Mild Onset quality:  Sudden Duration:  5 days Timing:  Constant Progression:  Worsening Chronicity:  New Context: not contact lens problem   Relieved by:  Nothing Worsened by:  Contact Ineffective treatments: warm compresses. Associated symptoms: inflammation, redness and swelling   Associated symptoms: no blurred vision, no crusting, no decreased vision, no discharge, no double vision, no photophobia and no tearing     Past Medical History:  Diagnosis Date  . Migraines   . Thyroid disease     Patient Active Problem List   Diagnosis Date Noted  . Vitamin D deficiency 01/18/2019  . Acquired hypothyroidism 03/02/2018  . Perennial allergic rhinitis with seasonal variation 03/02/2018  . Migraine without aura and without status migrainosus, not intractable 05/11/2016    Past Surgical History:  Procedure Laterality Date  . CESAREAN SECTION      OB History    Gravida  2   Para  2   Term  2   Preterm      AB      Living  2     SAB      TAB      Ectopic      Multiple      Live Births  2            Home Medications    Prior to Admission medications   Medication Sig Start Date End Date Taking? Authorizing Provider  levothyroxine (SYNTHROID) 112 MCG tablet TAKE 1 TABLET BY MOUTH ONCE DAILY 6 AM IN THE MORNING 02/09/20  Yes Cirigliano, Mary K, DO  naproxen (NAPROSYN) 500 MG tablet Take 1 tablet (500 mg total) by mouth every 12 (twelve) hours as needed. 03/20/19  Yes Everlena Cooper,  Adam R, DO  nortriptyline (PAMELOR) 10 MG capsule Take 1 capsule (10 mg total) by mouth at bedtime. 06/04/20  Yes Jaffe, Adam R, DO  SUMAtriptan (IMITREX) 100 MG tablet Take 1 tab earliest onset of migraine.  May repeat in 2 hours if headache persists or recurs. Max 2 tabs/24 hrs 06/04/20  Yes Jaffe, Adam R, DO  meloxicam (MOBIC) 7.5 MG tablet Take 1 tablet (7.5 mg total) by mouth daily as needed for pain. 01/10/19   Cristie Hem, PA-C  sulfacetamide (BLEPH-10) 10 % ophthalmic solution Place 1-2 drops into the right eye every 3 (three) hours while awake. 08/23/20   Lattie Haw, MD  Vitamin D, Ergocalciferol, (DRISDOL) 1.25 MG (50000 UT) CAPS capsule Take 1 capsule (50,000 Units total) by mouth every 7 (seven) days. 01/18/19   CiriglianoJearld Lesch, DO    Family History Family History  Problem Relation Age of Onset  . Hypertension Mother   . Diabetes Father     Social History Social History   Tobacco Use  . Smoking status: Never Smoker  . Smokeless tobacco: Never Used  Vaping Use  . Vaping Use: Never used  Substance Use Topics  . Alcohol  use: Never  . Drug use: Never     Allergies   Patient has no known allergies.   Review of Systems Review of Systems  Constitutional: Negative for chills, diaphoresis and fever.  Eyes: Positive for redness. Negative for blurred vision, double vision, photophobia, discharge and visual disturbance.  All other systems reviewed and are negative.    Physical Exam Triage Vital Signs ED Triage Vitals  Enc Vitals Group     BP 08/23/20 1047 105/74     Pulse Rate 08/23/20 1047 70     Resp 08/23/20 1047 15     Temp 08/23/20 1047 98 F (36.7 C)     Temp src --      SpO2 08/23/20 1047 99 %     Weight --      Height --      Head Circumference --      Peak Flow --      Pain Score 08/23/20 1048 2     Pain Loc --      Pain Edu? --      Excl. in GC? --    No data found.  Updated Vital Signs BP 105/74 (BP Location: Right Arm)   Pulse 70    Temp 98 F (36.7 C)   Resp 15   LMP 07/25/2020 (Approximate)   SpO2 99%   Visual Acuity Right Eye Distance:   Left Eye Distance:   Bilateral Distance:    Right Eye Near:   Left Eye Near:    Bilateral Near:     Physical Exam Vitals and nursing note reviewed.  Constitutional:      General: She is not in acute distress. HENT:     Head: Normocephalic.     Right Ear: External ear normal.     Left Ear: External ear normal.     Nose: Nose normal.     Mouth/Throat:     Pharynx: Oropharynx is clear.  Eyes:     Extraocular Movements: Extraocular movements intact.     Conjunctiva/sclera: Conjunctivae normal.      Comments: Right upper eyelid is swollen, erythematous, and tender to palpation as noted on diagram.  Area is not fluctuant.  Cardiovascular:     Rate and Rhythm: Normal rate.  Pulmonary:     Effort: Pulmonary effort is normal.  Musculoskeletal:     Cervical back: Neck supple.  Lymphadenopathy:     Cervical: No cervical adenopathy.  Skin:    General: Skin is warm and dry.     Findings: No rash.  Neurological:     Mental Status: She is alert.      UC Treatments / Results  Labs (all labs ordered are listed, but only abnormal results are displayed) Labs Reviewed - No data to display  EKG   Radiology No results found.  Procedures Procedures (including critical care time)  Medications Ordered in UC Medications - No data to display  Initial Impression / Assessment and Plan / UC Course  I have reviewed the triage vital signs and the nursing notes.  Pertinent labs & imaging results that were available during my care of the patient were reviewed by me and considered in my medical decision making (see chart for details).    Begin sulfacetamide ophthalmic suspension. Followup with ophthalmologist if not improved one week.   Final Clinical Impressions(s) / UC Diagnoses   Final diagnoses:  Hordeolum externum of right upper eyelid     Discharge  Instructions     Apply  a warm, wet cloth (warm compress) to your eye for 5-10 minutes, 4 times a day.  If symptoms become significantly worse during the night or over the weekend, proceed to the local emergency room.     ED Prescriptions    Medication Sig Dispense Auth. Provider   sulfacetamide (BLEPH-10) 10 % ophthalmic solution Place 1-2 drops into the right eye every 3 (three) hours while awake. 5 mL Lattie Haw, MD        Lattie Haw, MD 08/24/20 [phone]

## 2020-08-26 ENCOUNTER — Encounter: Payer: Self-pay | Admitting: Family Medicine

## 2020-09-03 ENCOUNTER — Ambulatory Visit (INDEPENDENT_AMBULATORY_CARE_PROVIDER_SITE_OTHER): Payer: 59

## 2020-09-03 DIAGNOSIS — Z111 Encounter for screening for respiratory tuberculosis: Secondary | ICD-10-CM | POA: Diagnosis not present

## 2020-09-03 DIAGNOSIS — Z23 Encounter for immunization: Secondary | ICD-10-CM

## 2020-09-03 NOTE — Progress Notes (Signed)
Pt came in per Dr. Barron Alvine orders and received a PPD placement in her left arm, pt tolerated injection well and scheduled to return Thursday for reading. Pt will require a work note stating she had a physical on 03/15/2020, received her flu shot and results of PPD skin test.

## 2020-09-05 ENCOUNTER — Ambulatory Visit: Payer: 59

## 2020-09-05 ENCOUNTER — Other Ambulatory Visit: Payer: Self-pay

## 2020-09-05 DIAGNOSIS — Z111 Encounter for screening for respiratory tuberculosis: Secondary | ICD-10-CM

## 2020-09-05 NOTE — Progress Notes (Signed)
PPD Reading Note  PPD read and results entered in [employer].  Result: 0 mm induration.  Interpretation: Negative  If test not read within 48-72 hours of initial placement, patient advised to repeat in other arm 1-3 weeks after this test.  Allergic reaction: no

## 2020-09-12 MED FILL — NORTRIPTYLINE HCL 10 MG CAP: 10 | 30 days supply | Qty: 30 | Fill #3

## 2020-10-08 NOTE — Progress Notes (Signed)
NEUROLOGY FOLLOW UP OFFICE NOTE  Kristina Sherman [id number]  HISTORY OF PRESENT ILLNESS: Kristina Sherman is a 44 year old woman whofollows up for migraine.  UPDATE: Started nortriptyline in February. Intensity:7/10 Duration:Within an hour Frequency:8 days a month. She has a dull mild headache 8 days a month.   Rescue protocol: Sumatriptan 100mg  Current NSAIDS:none Current analgesics:none Current triptans:Sumatriptan100mg  Current ergotamine:none Current anti-emetic:none Current muscle relaxants:none Current anti-anxiolytic:none Current sleep aide:none Current Antihypertensive medications:none Current Antidepressant medications:nortriptyline 10mg  at bedtime Current Anticonvulsant medications:none Current anti-CGRP:none Current Vitamins/Herbal/Supplements:D Current Antihistamines/Decongestants:none Other therapy:none Hormone/birth control:none Other medications:Synthroid  Caffeine:2 to 4 cups of tea daily Diet:2-3 16 oz bottles of water daily Exercise:yes Depression:no; Anxiety:no Other pain:no Sleep hygiene:varies  HISTORY: Onset: Since childhood. More frequent since around 2015-2016 Location:left sided from behind left eye to back of head, sometimes right side Quality:Stabbing needle Initial intensity:7/10.Shedenies new headache, thunderclap headache Aura:no Premonitory Phase:no Postdrome:no Associated symptoms:When severe there isassociated nauseaand vomiting.Shedenies associated photophobia, phonophobia, visual disturbance,unilateral numbness or weakness. Initial duration:2 to 5 days. InitialFrequency:On average it occurs every 6 weeks. When it occurs, she often will wake up in the middle of the night with it. InitialFrequency of abortive medication:2 to 5 days every 6 weeks Triggers: Sleep deprivation, jumping, loud talking Relieving factors:  None Activity:aggravates  Past NSAIDS:Mobic Past analgesics:Excedrin Past abortive triptans:none Past abortive ergotamine:none Past muscle relaxants:none Past anti-emetic:Promethazine 25mg  Past antihypertensive medications:none Past antidepressant medications:none Past anticonvulsant medications:  topiramate 50mg  twice daily (dizziness, memory problems) Past anti-CGRP:none Past vitamins/Herbal/Supplements:none Past antihistamines/decongestants:none Other past therapies:none  Family history of headache:no  PAST MEDICAL HISTORY: Past Medical History:  Diagnosis Date  . Migraines   . Thyroid disease     MEDICATIONS: Current Outpatient Medications on File Prior to Visit  Medication Sig Dispense Refill  . levothyroxine (SYNTHROID) 112 MCG tablet TAKE 1 TABLET BY MOUTH ONCE DAILY 6 AM IN THE MORNING 90 tablet 0  . meloxicam (MOBIC) 7.5 MG tablet Take 1 tablet (7.5 mg total) by mouth daily as needed for pain. 30 tablet 2  . naproxen (NAPROSYN) 500 MG tablet Take 1 tablet (500 mg total) by mouth every 12 (twelve) hours as needed. 20 tablet 3  . nortriptyline (PAMELOR) 10 MG capsule Take 1 capsule (10 mg total) by mouth at bedtime. 30 capsule 3  . sulfacetamide (BLEPH-10) 10 % ophthalmic solution Place 1-2 drops into the right eye every 3 (three) hours while awake. 5 mL 0  . SUMAtriptan (IMITREX) 100 MG tablet Take 1 tab earliest onset of migraine.  May repeat in 2 hours if headache persists or recurs. Max 2 tabs/24 hrs 10 tablet 5  . Vitamin D, Ergocalciferol, (DRISDOL) 1.25 MG (50000 UT) CAPS capsule Take 1 capsule (50,000 Units total) by mouth every 7 (seven) days. 4 capsule 3   No current facility-administered medications on file prior to visit.    ALLERGIES: No Known Allergies  FAMILY HISTORY: Family History  Problem Relation Age of Onset  . Hypertension Mother   . Diabetes Father     SOCIAL HISTORY: Social History   Socioeconomic  History  . Marital status: Married    Spouse name: Not on file  . Number of children: 2  . Years of education: Not on file  . Highest education level: Not on file  Occupational History  . Occupation: uemployed  Tobacco Use  . Smoking status: Never Smoker  . Smokeless tobacco: Never Used  Vaping Use  . Vaping Use: Never used  Substance and Sexual Activity  .  Alcohol use: Never  . Drug use: Never  . Sexual activity: Yes    Birth control/protection: I.U.D.  Other Topics Concern  . Not on file  Social History Narrative   Patient is right-handed. Patient lives with her husband and 2 children in 2 story home. She drinks 3 cups of tea a day. She exercise daily, yoga and walking.   Social Determinants of Health   Financial Resource Strain:   . Difficulty of Paying Living Expenses: Not on file  Food Insecurity:   . Worried About Programme researcher, broadcasting/film/video in the Last Year: Not on file  . Ran Out of Food in the Last Year: Not on file  Transportation Needs:   . Lack of Transportation (Medical): Not on file  . Lack of Transportation (Non-Medical): Not on file  Physical Activity:   . Days of Exercise per Week: Not on file  . Minutes of Exercise per Session: Not on file  Stress:   . Feeling of Stress : Not on file  Social Connections:   . Frequency of Communication with Friends and Family: Not on file  . Frequency of Social Gatherings with Friends and Family: Not on file  . Attends Religious Services: Not on file  . Active Member of Clubs or Organizations: Not on file  . Attends Banker Meetings: Not on file  . Marital Status: Not on file  Intimate Partner Violence:   . Fear of Current or Ex-Partner: Not on file  . Emotionally Abused: Not on file  . Physically Abused: Not on file  . Sexually Abused: Not on file   PHYSICAL EXAM: Blood pressure 106/70, pulse 77, resp. rate 20, height 5\' 2"  (1.575 m), weight 118 lb (53.5 kg), SpO2 98 %. General: No acute distress.  Patient  appears well-groomed.    IMPRESSION: Chronic migraine without aura, without status migrainosus, not intractable  PLAN: 1.  Increase nortriptyline to 25mg  at bedtime.  If no improvement in 6 weeks, would increase to 50mg  at bedtime 2.  Sumatriptan 100mg  for rescue 3.  Limit use of pain relievers to no more than 2 days out of week to prevent risk of rebound or medication-overuse headache. 4.  Keep headache diary 5.  Follow up 6 months  , DO  CC: , DO

## 2020-10-09 ENCOUNTER — Ambulatory Visit (INDEPENDENT_AMBULATORY_CARE_PROVIDER_SITE_OTHER): Payer: 59 | Admitting: Neurology

## 2020-10-09 ENCOUNTER — Encounter: Payer: Self-pay | Admitting: Neurology

## 2020-10-09 ENCOUNTER — Other Ambulatory Visit: Payer: Self-pay | Admitting: Neurology

## 2020-10-09 ENCOUNTER — Other Ambulatory Visit: Payer: Self-pay

## 2020-10-09 VITALS — BP 106/70 | HR 77 | Resp 20 | Ht 62.0 in | Wt 118.0 lb

## 2020-10-09 DIAGNOSIS — G43009 Migraine without aura, not intractable, without status migrainosus: Secondary | ICD-10-CM | POA: Diagnosis not present

## 2020-10-09 MED ORDER — NORTRIPTYLINE HCL 25 MG PO CAPS
25.0000 mg | ORAL_CAPSULE | Freq: Every day | ORAL | 5 refills | Status: DC
Start: 2020-10-09 — End: 2020-10-09

## 2020-10-09 MED ORDER — SUMATRIPTAN SUCCINATE 100 MG PO TABS: ORAL_TABLET | ORAL | 5 refills | Status: DC

## 2020-10-09 MED FILL — NORTRIPTYLINE HCL 25 MG CAP: 25 | 30 days supply | Qty: 30 | Fill #0

## 2020-10-09 MED FILL — SUMAtriptan SUCCINATE 100 M: 100 | 30 days supply | Qty: 9 | Fill #0

## 2020-10-09 NOTE — Patient Instructions (Signed)
  1. Increase nortriptyline to 25mg  at bedtime.  Contact in 6 weeks with update and we can increase dose if needed. 2. Take sumatriptan 100mg  at earliest onset of headache.  May repeat dose once in 2 hours if needed.  Maximum 2 tablets in 24 hours. 3. Limit use of pain relievers to no more than 2 days out of the week.  These medications include acetaminophen, NSAIDs (ibuprofen/Advil/Motrin, naproxen/Aleve, triptans (Imitrex/sumatriptan), Excedrin, and narcotics.  This will help reduce risk of rebound headaches. 4. Be aware of common food triggers:  - Caffeine:  coffee, black tea, cola, Mt. Dew  - Chocolate  - Dairy:  aged cheeses (brie, blue, cheddar, gouda, [place], provolone, [place], Swiss, etc), chocolate milk, buttermilk, sour cream, limit eggs and yogurt  - Nuts, peanut butter  - Alcohol  - Cereals/grains:  FRESH breads (fresh bagels, sourdough, doughnuts), yeast productions  - Processed/canned/aged/cured meats (pre-packaged deli meats, hotdogs)  - MSG/glutamate:  soy sauce, flavor enhancer, pickled/preserved/marinated foods  - Sweeteners:  aspartame (Equal, Nutrasweet).  Sugar and Splenda are okay  - Vegetables:  legumes (lima beans, lentils, snow peas, fava beans, pinto peans, peas, garbanzo beans), sauerkraut, onions, olives, pickles  - Fruit:  avocados, bananas, citrus fruit (orange, lemon, grapefruit), mango  - Other:  Frozen meals, macaroni and cheese 5. Routine exercise 6. Stay adequately hydrated (aim for 64 oz water daily) 7. Keep headache diary 8. Maintain proper stress management 9. Maintain proper sleep hygiene 10. Do not skip meals 11. Consider supplements:  magnesium citrate 400mg  daily, riboflavin 400mg  daily, coenzyme Q10 100mg  three times daily.

## 2020-11-12 MED FILL — SUMAtriptan SUCCINATE 100 M: 100 | 30 days supply | Qty: 9 | Fill #1

## 2020-11-12 MED FILL — LEVOTHYROXINE SODIUM 112 MC: 112 | 90 days supply | Qty: 90 | Fill #2

## 2020-11-12 MED FILL — NORTRIPTYLINE HCL 25 MG CAP: 25 | 30 days supply | Qty: 30 | Fill #1

## 2020-12-06 MED FILL — SUMAtriptan SUCCINATE 100 M: 100 | 30 days supply | Qty: 9 | Fill #2

## 2020-12-16 ENCOUNTER — Other Ambulatory Visit: Payer: 59

## 2020-12-16 DIAGNOSIS — Z20822 Contact with and (suspected) exposure to covid-19: Secondary | ICD-10-CM

## 2020-12-16 NOTE — Progress Notes (Signed)
Virtual Visit via Video Note The purpose of this virtual visit is to provide medical care while limiting exposure to the novel coronavirus.    Consent was obtained for video visit:  Yes.   Answered questions that patient had about telehealth interaction:  Yes.   I discussed the limitations, risks, security and privacy concerns of performing an evaluation and management service by telemedicine. I also discussed with the patient that there may be a patient responsible charge related to this service. The patient expressed understanding and agreed to proceed.  Pt location: Home Physician Location: office Name of referring provider:  Overton Mam, DO I connected with Leasha P Nunley at patients initiation/request on 12/17/2020 at  8:30 AM EST by video enabled telemedicine application and verified that I am speaking with the correct person using two identifiers. Pt MRN:  [id number] Pt DOB:  [birth year] Video Participants:  Landis Martins   History of Present Illness:  Kristina Sherman is a 45year old woman whofollows up for migraine.  UPDATE: Increased nortriptyline in November.  She was averaging 8-9 moderate-severe headaches, lasting within an hour with sumatriptan, however no headaches since Dec 27.   She has a dull mild headache 8 days a month.   Rescue protocol: Sumatriptan 100mg  Current NSAIDS:none Current analgesics:none Current triptans:Sumatriptan100mg  Current ergotamine:none Current anti-emetic:none Current muscle relaxants:none Current anti-anxiolytic:none Current sleep aide:none Current Antihypertensive medications:none Current Antidepressant medications:nortriptyline 25mg  at bedtime Current Anticonvulsant medications:none Current anti-CGRP:none Current Vitamins/Herbal/Supplements:D Current Antihistamines/Decongestants:none Other therapy:none Hormone/birth control:none Other medications:Synthroid  Caffeine:2 to 4 cups of tea  daily Diet:2-3 16 oz bottles of water daily Exercise:yes Depression:no; Anxiety:no Other pain:no Sleep hygiene:varies  HISTORY: Onset: Since childhood. More frequent since around 2015-2016 Location:left sided from behind left eye to back of head, sometimes right side Quality:Stabbing needle Initial intensity:7/10.Shedenies new headache, thunderclap headache Aura:no Premonitory Phase:no Postdrome:no Associated symptoms:When severe there isassociated nauseaand vomiting.Shedenies associated photophobia, phonophobia, visual disturbance,unilateral numbness or weakness. Initial duration:2 to 5 days. InitialFrequency:On average it occurs every 6 weeks. When it occurs, she often will wake up in the middle of the night with it. InitialFrequency of abortive medication:2 to 5 days every 6 weeks Triggers: Sleep deprivation, jumping, loud talking, cold air Relieving factors: None Activity:aggravates  Past NSAIDS:Mobic Past analgesics:Excedrin Past abortive triptans:none Past abortive ergotamine:none Past muscle relaxants:none Past anti-emetic:Promethazine 25mg  Past antihypertensive medications:none Past antidepressant medications:none Past anticonvulsant medications: topiramate 50mg  twice daily (dizziness, memory problems) Past anti-CGRP:none Past vitamins/Herbal/Supplements:none Past antihistamines/decongestants:none Other past therapies:none  Family history of headache:no   Past Medical History: Past Medical History:  Diagnosis Date  . Migraines   . Thyroid disease     Medications: Outpatient Encounter Medications as of 12/17/2020  Medication Sig  . levothyroxine (SYNTHROID) 112 MCG tablet TAKE 1 TABLET BY MOUTH ONCE DAILY 6 AM IN THE MORNING  . meloxicam (MOBIC) 7.5 MG tablet Take 1 tablet (7.5 mg total) by mouth daily as needed for pain.  . naproxen (NAPROSYN) 500 MG tablet Take 1 tablet (500 mg  total) by mouth every 12 (twelve) hours as needed.  . nortriptyline (PAMELOR) 25 MG capsule Take 1 capsule (25 mg total) by mouth at bedtime.  . sulfacetamide (BLEPH-10) 10 % ophthalmic solution Place 1-2 drops into the right eye every 3 (three) hours while awake.  . SUMAtriptan (IMITREX) 100 MG tablet Take 1 tab earliest onset of migraine.  May repeat in 2 hours if headache persists or recurs. Max 2 tabs/24 hrs  . Vitamin D, Ergocalciferol, (DRISDOL) 1.25 MG (50000  UT) CAPS capsule Take 1 capsule (50,000 Units total) by mouth every 7 (seven) days.   No facility-administered encounter medications on file as of 12/17/2020.    Allergies: No Known Allergies  Family History: Family History  Problem Relation Age of Onset  . Hypertension Mother   . Diabetes Father     Social History: Social History   Socioeconomic History  . Marital status: Married    Spouse name: Not on file  . Number of children: 2  . Years of education: Not on file  . Highest education level: Not on file  Occupational History  . Occupation: uemployed  Tobacco Use  . Smoking status: Never Smoker  . Smokeless tobacco: Never Used  Vaping Use  . Vaping Use: Never used  Substance and Sexual Activity  . Alcohol use: Never  . Drug use: Never  . Sexual activity: Yes    Birth control/protection: I.U.D.  Other Topics Concern  . Not on file  Social History Narrative   Patient is right-handed. Patient lives with her husband and 2 children in 2 story home. She drinks 3 cups of tea a day. She exercise daily, yoga and walking.   Social Determinants of Health   Financial Resource Strain: Not on file  Food Insecurity: Not on file  Transportation Needs: Not on file  Physical Activity: Not on file  Stress: Not on file  Social Connections: Not on file  Intimate Partner Violence: Not on file    Observations/Objective:   There were no vitals taken for this visit. No acute distress.  Alert and oriented.  Speech fluent  and not dysarthric.  Language intact.    Assessment and Plan:   Migraine without aura, without status migrainosus, not intractable.  Improvement over the past 2 weeks.    1.  Continue nortriptyline 25mg  at bedtime.  She will keep track of headaches for rest of January.  If she feels that they are frequent, she will contact me at end of January and we can increase dose to 50mg  at bedtime 2.  Sumatriptan 100mg  for migraine rescue 3.  Limit use of pain relievers to no more than 2 days out of week to prevent risk of rebound or medication-overuse headache. 4.  Keep headache diary 5.  Follow up 6 months.  Follow Up Instructions:    -I discussed the assessment and treatment plan with the patient. The patient was provided an opportunity to ask questions and all were answered. The patient agreed with the plan and demonstrated an understanding of the instructions.   The patient was advised to call back or seek an in-person evaluation if the symptoms worsen or if the condition fails to improve as anticipated.    February, DO   CC:  , DO

## 2020-12-17 ENCOUNTER — Telehealth (INDEPENDENT_AMBULATORY_CARE_PROVIDER_SITE_OTHER): Payer: 59 | Admitting: Neurology

## 2020-12-17 ENCOUNTER — Other Ambulatory Visit: Payer: Self-pay

## 2020-12-17 ENCOUNTER — Encounter: Payer: Self-pay | Admitting: Neurology

## 2020-12-17 DIAGNOSIS — G43009 Migraine without aura, not intractable, without status migrainosus: Secondary | ICD-10-CM

## 2020-12-17 MED FILL — NORTRIPTYLINE HCL 25 MG CAP: 25 | 30 days supply | Qty: 30 | Fill #2

## 2020-12-18 LAB — SARS-COV-2, NAA 2 DAY TAT

## 2020-12-18 LAB — NOVEL CORONAVIRUS, NAA: SARS-CoV-2, NAA: NOT DETECTED

## 2020-12-18 MED FILL — SUMAtriptan SUCCINATE 100 M: 100 | 30 days supply | Qty: 9 | Fill #2

## 2021-01-10 ENCOUNTER — Other Ambulatory Visit: Payer: Self-pay

## 2021-01-10 MED ORDER — SUMATRIPTAN SUCCINATE 100 MG PO TABS: ORAL_TABLET | ORAL | 5 refills | Status: DC

## 2021-01-10 NOTE — Progress Notes (Signed)
PER My chart request refill sent to [facility].

## 2021-01-13 MED FILL — SUMAtriptan SUCCINATE 100 M: 100 | 30 days supply | Qty: 9 | Fill #3

## 2021-01-21 MED FILL — NORTRIPTYLINE HCL 25 MG CAP: 25 | 30 days supply | Qty: 30 | Fill #3

## 2021-02-13 ENCOUNTER — Other Ambulatory Visit: Payer: Self-pay | Admitting: Family Medicine

## 2021-02-13 MED FILL — LEVOTHYROXINE SODIUM 112 MC: 112 | 90 days supply | Qty: 90 | Fill #0

## 2021-02-13 MED FILL — SUMAtriptan SUCCINATE 100 M: 100 | 30 days supply | Qty: 9 | Fill #4

## 2021-02-24 MED FILL — NORTRIPTYLINE HCL 25 MG CAP: 25 | 30 days supply | Qty: 30 | Fill #4

## 2021-03-08 ENCOUNTER — Other Ambulatory Visit (HOSPITAL_COMMUNITY): Payer: Self-pay

## 2021-03-11 ENCOUNTER — Other Ambulatory Visit (HOSPITAL_BASED_OUTPATIENT_CLINIC_OR_DEPARTMENT_OTHER): Payer: Self-pay | Admitting: Family Medicine

## 2021-03-11 DIAGNOSIS — Z1231 Encounter for screening mammogram for malignant neoplasm of breast: Secondary | ICD-10-CM

## 2021-03-27 ENCOUNTER — Other Ambulatory Visit (HOSPITAL_COMMUNITY): Payer: Self-pay

## 2021-03-27 MED FILL — Nortriptyline HCl Cap 25 MG: ORAL | 30 days supply | Qty: 30 | Fill #0 | Status: AC

## 2021-03-28 ENCOUNTER — Other Ambulatory Visit (HOSPITAL_COMMUNITY): Payer: Self-pay

## 2021-03-28 ENCOUNTER — Other Ambulatory Visit: Payer: Self-pay

## 2021-03-28 ENCOUNTER — Encounter: Payer: 59 | Admitting: Family Medicine

## 2021-03-28 ENCOUNTER — Ambulatory Visit (INDEPENDENT_AMBULATORY_CARE_PROVIDER_SITE_OTHER): Payer: 59 | Admitting: Family Medicine

## 2021-03-28 ENCOUNTER — Encounter: Payer: Self-pay | Admitting: Family Medicine

## 2021-03-28 VITALS — BP 92/78 | HR 72 | Temp 98.2°F | Ht 61.0 in | Wt 117.0 lb

## 2021-03-28 DIAGNOSIS — G43009 Migraine without aura, not intractable, without status migrainosus: Secondary | ICD-10-CM

## 2021-03-28 DIAGNOSIS — E039 Hypothyroidism, unspecified: Secondary | ICD-10-CM | POA: Diagnosis not present

## 2021-03-28 DIAGNOSIS — Z0000 Encounter for general adult medical examination without abnormal findings: Secondary | ICD-10-CM

## 2021-03-28 DIAGNOSIS — Z13220 Encounter for screening for lipoid disorders: Secondary | ICD-10-CM

## 2021-03-28 DIAGNOSIS — E559 Vitamin D deficiency, unspecified: Secondary | ICD-10-CM | POA: Diagnosis not present

## 2021-03-28 LAB — BASIC METABOLIC PANEL
BUN: 9 mg/dL (ref 6–23)
CO2: 31 mEq/L (ref 19–32)
Calcium: 9.2 mg/dL (ref 8.4–10.5)
Chloride: 104 mEq/L (ref 96–112)
Creatinine, Ser: 0.88 mg/dL (ref 0.40–1.20)
GFR: 80.91 mL/min (ref >60.00)
Glucose, Bld: 84 mg/dL (ref 70–99)
Potassium: 4.7 mEq/L (ref 3.5–5.1)
Sodium: 139 mEq/L (ref 135–145)

## 2021-03-28 LAB — CBC
HCT: 37.6 % (ref 36.0–46.0)
Hemoglobin: 12.6 g/dL (ref 12.0–15.0)
MCHC: 33.5 g/dL (ref 30.0–36.0)
MCV: 85.5 fl (ref 78.0–100.0)
Platelets: 177 10*3/uL (ref 150.0–400.0)
RBC: 4.4 Mil/uL (ref 3.87–5.11)
RDW: 12.8 % (ref 11.5–15.5)
WBC: 4.3 10*3/uL (ref 4.0–10.5)

## 2021-03-28 LAB — ALT: ALT: 6 U/L (ref 0–35)

## 2021-03-28 LAB — LIPID PANEL
Cholesterol: 141 mg/dL (ref 0–200)
HDL: 59.5 mg/dL (ref >39.00)
LDL Cholesterol: 75 mg/dL (ref 0–99)
NonHDL: 81.66
Total CHOL/HDL Ratio: 2
Triglycerides: 35 mg/dL (ref 0.0–149.0)
VLDL: 7 mg/dL (ref 0.0–40.0)

## 2021-03-28 LAB — AST: AST: 20 U/L (ref 0–37)

## 2021-03-28 LAB — T4, FREE: Free T4: 1.1 ng/dL (ref 0.60–1.60)

## 2021-03-28 LAB — VITAMIN D 25 HYDROXY (VIT D DEFICIENCY, FRACTURES): VITD: 24.89 ng/mL — ABNORMAL LOW (ref 30.00–100.00)

## 2021-03-28 LAB — TSH: TSH: 1.09 u[IU]/mL (ref 0.35–4.50)

## 2021-03-28 MED ORDER — NORTRIPTYLINE HCL 25 MG PO CAPS
25.0000 mg | ORAL_CAPSULE | Freq: Every day | ORAL | 3 refills | Status: DC
  Filled 2021-03-28 – 2021-04-21 (×2): qty 90, 90d supply, fill #0
  Filled 2021-08-13: qty 90, 90d supply, fill #1
  Filled 2021-11-14: qty 90, 90d supply, fill #2
  Filled 2022-02-18: qty 90, 90d supply, fill #3

## 2021-03-28 MED ORDER — SUMATRIPTAN SUCCINATE 100 MG PO TABS
100.0000 mg | ORAL_TABLET | ORAL | 3 refills | Status: DC
  Filled 2021-03-28: qty 9, 30d supply, fill #0
  Filled 2021-04-21: qty 9, 30d supply, fill #1
  Filled 2021-05-04 – 2021-06-27 (×2): qty 9, 30d supply, fill #2
  Filled 2021-07-29: qty 9, 30d supply, fill #3
  Filled 2021-08-13 – 2021-08-21 (×3): qty 9, 30d supply, fill #4

## 2021-03-28 MED ORDER — AMOXICILLIN 500 MG PO CAPS
500.0000 mg | ORAL_CAPSULE | Freq: Three times a day (TID) | ORAL | 0 refills | Status: AC
  Filled 2021-03-28: qty 21, 7d supply, fill #0

## 2021-03-28 MED ORDER — LEVOTHYROXINE SODIUM 112 MCG PO TABS
112.0000 ug | ORAL_TABLET | Freq: Every day | ORAL | 3 refills | Status: DC
  Filled 2021-03-28 – 2021-06-27 (×3): qty 90, 90d supply, fill #0
  Filled 2021-09-30: qty 90, 90d supply, fill #1
  Filled 2021-12-30: qty 90, 90d supply, fill #2

## 2021-03-28 NOTE — Progress Notes (Signed)
Kristina Sherman is a 45 y.o. female  Chief Complaint  Patient presents with  . Annual Exam    Pt is fasting for lab work today. Pt would like to ask provider some questions in regards to her going out of country in [place]    HPI: Kristina Sherman is a 45 y.o. female patient seen today for annual CPE, fasting labs.   Last PAP: UTD - follows with Dr. Erin Fulling  Last mammo: scheduled on 04/08/21  Diet/Exercise: overall healthy, well-balanced; activity level varies but no regular CV exercise Dental: has appt today Vision: wears glasses and has upcoming appt  Med refills needed today? Yes per orders   Past Medical History:  Diagnosis Date  . Migraines   . Thyroid disease     Past Surgical History:  Procedure Laterality Date  . CESAREAN SECTION      Social History   Socioeconomic History  . Marital status: Married    Spouse name: Not on file  . Number of children: 2  . Years of education: Not on file  . Highest education level: Not on file  Occupational History  . Occupation: uemployed  Tobacco Use  . Smoking status: Never Smoker  . Smokeless tobacco: Never Used  Vaping Use  . Vaping Use: Never used  Substance and Sexual Activity  . Alcohol use: Never  . Drug use: Never  . Sexual activity: Yes    Birth control/protection: I.U.D.  Other Topics Concern  . Not on file  Social History Narrative   Patient is right-handed. Patient lives with her husband and 2 children in 2 story home. She drinks 3 cups of tea a day. She exercise daily, yoga and walking.   Social Determinants of Health   Financial Resource Strain: Not on file  Food Insecurity: Not on file  Transportation Needs: Not on file  Physical Activity: Not on file  Stress: Not on file  Social Connections: Not on file  Intimate Partner Violence: Not on file    Family History  Problem Relation Age of Onset  . Hypertension Mother   . Diabetes Father      Immunization History  Administered Date(s)  Administered  . Influenza,inj,Quad PF,6+ Mos 09/03/2020  . Influenza-Unspecified 12/18/2016, 10/14/2017, 11/07/2018  . PFIZER(Purple Top)SARS-COV-2 Vaccination 02/11/2020, 03/04/2020  . PPD Test 07/21/2017, 09/03/2020  . Tdap 03/15/2020    Outpatient Encounter Medications as of 03/28/2021  Medication Sig  . ibuprofen (ADVIL) 600 MG tablet Take 1 tablet by mouth every 8 (eight) hours as needed.  . meloxicam (MOBIC) 7.5 MG tablet Take 1 tablet (7.5 mg total) by mouth daily as needed for pain.  . Vitamin D, Ergocalciferol, (DRISDOL) 1.25 MG (50000 UT) CAPS capsule Take 1 capsule (50,000 Units total) by mouth every 7 (seven) days.  . [DISCONTINUED] levothyroxine (SYNTHROID) 112 MCG tablet TAKE 1 TABLET BY MOUTH ONCE DAILY AT 6AM.  . [DISCONTINUED] naproxen (NAPROSYN) 500 MG tablet Take 1 tablet (500 mg total) by mouth every 12 (twelve) hours as needed.  . [DISCONTINUED] nortriptyline (PAMELOR) 25 MG capsule TAKE 1 CAPSULE (25 MG TOTAL) BY MOUTH AT BEDTIME.  . [DISCONTINUED] SUMAtriptan (IMITREX) 100 MG tablet TAKE 1 TAB EARLIEST ONSET OF MIGRAINE. MAY REPEAT IN 2 HOURS IF HEADACHE PERSISTS OR RECURS. MAX 2 TABS/24 HRS  . levothyroxine (SYNTHROID) 112 MCG tablet TAKE 1 TABLET BY MOUTH ONCE DAILY AT 6AM.  . nortriptyline (PAMELOR) 25 MG capsule TAKE 1 CAPSULE (25 MG TOTAL) BY MOUTH AT BEDTIME.  Marland Kitchen  PFIZER-BIONTECH COVID-19 VACC 30 MCG/0.3ML injection   . SUMAtriptan (IMITREX) 100 MG tablet TAKE 1 TAB EARLIEST ONSET OF MIGRAINE. MAY REPEAT IN 2 HOURS IF HEADACHE PERSISTS OR RECURS. MAX 2 TABS/24 HRS  . [DISCONTINUED] sulfacetamide (BLEPH-10) 10 % ophthalmic solution Place 1-2 drops into the right eye every 3 (three) hours while awake. (Patient not taking: Reported on 03/28/2021)   No facility-administered encounter medications on file as of 03/28/2021.     ROS: Gen: no fever, chills  Skin: no rash, itching ENT: no ear pain, ear drainage, nasal congestion, rhinorrhea, sinus pressure, sore  throat Eyes: no blurry vision, double vision Resp: no cough, wheeze,SOB CV: no CP, palpitations, LE edema,  GI: no heartburn, n/v/d/c, abd pain GU: no dysuria, urgency, frequency, hematuria MSK: no joint pain, myalgias, back pain Neuro: h/o migraine headaches Psych: no depression, anxiety, insomnia   No Known Allergies  BP 92/78 (BP Location: Left Arm, Patient Position: Sitting, Cuff Size: Normal)   Pulse 72   Temp 98.2 F (36.8 C) (Oral)   Ht 5\' 1"  (1.549 m)   Wt 117 lb (53.1 kg)   SpO2 100%   BMI 22.11 kg/m   Wt Readings from Last 3 Encounters:  03/28/21 117 lb (53.1 kg)  10/09/20 118 lb (53.5 kg)  05/22/20 116 lb (52.6 kg)   Temp Readings from Last 3 Encounters:  03/28/21 98.2 F (36.8 C) (Oral)  08/23/20 98 F (36.7 C)  03/15/20 (!) 97.5 F (36.4 C) (Tympanic)   BP Readings from Last 3 Encounters:  03/28/21 92/78  10/09/20 106/70  08/23/20 105/74   Pulse Readings from Last 3 Encounters:  03/28/21 72  10/09/20 77  08/23/20 70    Physical Exam Constitutional:      General: She is not in acute distress.    Appearance: She is well-developed. She is not ill-appearing.  HENT:     Head: Normocephalic and atraumatic.     Right Ear: Tympanic membrane and ear canal normal.     Left Ear: Tympanic membrane and ear canal normal.     Nose: Nose normal.     Mouth/Throat:     Mouth: Mucous membranes are moist.     Pharynx: Oropharynx is clear.  Eyes:     Conjunctiva/sclera: Conjunctivae normal.  Neck:     Thyroid: No thyromegaly.  Cardiovascular:     Rate and Rhythm: Normal rate and regular rhythm.     Heart sounds: Normal heart sounds. No murmur heard.   Pulmonary:     Effort: Pulmonary effort is normal. No respiratory distress.     Breath sounds: Normal breath sounds. No wheezing or rhonchi.  Abdominal:     General: Bowel sounds are normal. There is no distension.     Palpations: Abdomen is soft. There is no mass.     Tenderness: There is no abdominal  tenderness.  Musculoskeletal:     Cervical back: Neck supple.     Right lower leg: No edema.     Left lower leg: No edema.  Lymphadenopathy:     Cervical: No cervical adenopathy.  Skin:    General: Skin is warm and dry.  Neurological:     Mental Status: She is alert and oriented to person, place, and time.     Motor: No abnormal muscle tone.     Coordination: Coordination normal.  Psychiatric:        Behavior: Behavior normal.      A/P:   1. Annual physical exam - discussed  importance of regular CV exercise, healthy diet, adequate sleep - UTD on PAP and mammo - immunizations UTD - dental and vision exams scheduled in the next week - CBC - Basic metabolic panel - AST - ALT - next CPE in 1 year  2. Vitamin D deficiency - VITAMIN D 25 Hydroxy (Vit-D Deficiency, Fractures)  3. Acquired hypothyroidism - TSH - T4, free Refill: - levothyroxine (SYNTHROID) 112 MCG tablet; TAKE 1 TABLET BY MOUTH ONCE DAILY AT 6AM.  Dispense: 90 tablet; Refill: 3  4. Screening for lipid disorders - Lipid panel  5. Migraine without aura and without status migrainosus, not intractable - stable, controlled Refill: - SUMAtriptan (IMITREX) 100 MG tablet; TAKE 1 TAB EARLIEST ONSET OF MIGRAINE. MAY REPEAT IN 2 HOURS IF HEADACHE PERSISTS OR RECURS. MAX 2 TABS/24 HRS  Dispense: 30 tablet; Refill: 3 - nortriptyline (PAMELOR) 25 MG capsule; TAKE 1 CAPSULE (25 MG TOTAL) BY MOUTH AT BEDTIME.  Dispense: 90 capsule; Refill: 3   This visit occurred during the SARS-CoV-2 public health emergency.  Safety protocols were in place, including screening questions prior to the visit, additional usage of staff PPE, and extensive cleaning of exam room while observing appropriate contact time as indicated for disinfecting solutions.

## 2021-03-28 NOTE — Patient Instructions (Signed)
[email or web address] [facility] [place] [phone] [address] [place], [place] [address]

## 2021-04-01 ENCOUNTER — Encounter: Payer: Self-pay | Admitting: Family Medicine

## 2021-04-01 DIAGNOSIS — Z298 Encounter for other specified prophylactic measures: Secondary | ICD-10-CM

## 2021-04-02 ENCOUNTER — Other Ambulatory Visit (HOSPITAL_COMMUNITY): Payer: Self-pay

## 2021-04-02 ENCOUNTER — Encounter: Payer: Self-pay | Admitting: Family Medicine

## 2021-04-02 MED ORDER — MEFLOQUINE HCL 250 MG PO TABS
250.0000 mg | ORAL_TABLET | ORAL | 0 refills | Status: AC
Start: 2021-04-02 — End: 2021-07-03
  Filled 2021-04-02: qty 14, 90d supply, fill #0

## 2021-04-08 ENCOUNTER — Ambulatory Visit (HOSPITAL_BASED_OUTPATIENT_CLINIC_OR_DEPARTMENT_OTHER)
Admission: RE | Admit: 2021-04-08 | Discharge: 2021-04-08 | Disposition: A | Discharge status: Home / Self Care | Payer: 59 | Source: Ambulatory Visit | Attending: Family Medicine | Admitting: Family Medicine

## 2021-04-08 ENCOUNTER — Other Ambulatory Visit: Payer: Self-pay

## 2021-04-08 ENCOUNTER — Encounter (HOSPITAL_BASED_OUTPATIENT_CLINIC_OR_DEPARTMENT_OTHER): Payer: Self-pay

## 2021-04-08 DIAGNOSIS — Z1231 Encounter for screening mammogram for malignant neoplasm of breast: Secondary | ICD-10-CM | POA: Insufficient documentation

## 2021-04-10 DIAGNOSIS — H5213 Myopia, bilateral: Secondary | ICD-10-CM | POA: Diagnosis not present

## 2021-04-10 DIAGNOSIS — H52223 Regular astigmatism, bilateral: Secondary | ICD-10-CM | POA: Diagnosis not present

## 2021-04-22 ENCOUNTER — Other Ambulatory Visit (HOSPITAL_COMMUNITY): Payer: Self-pay

## 2021-05-06 ENCOUNTER — Other Ambulatory Visit (HOSPITAL_COMMUNITY): Payer: Self-pay

## 2021-05-14 ENCOUNTER — Other Ambulatory Visit (HOSPITAL_COMMUNITY): Payer: Self-pay

## 2021-06-20 ENCOUNTER — Ambulatory Visit: Payer: 59 | Admitting: Neurology

## 2021-06-27 ENCOUNTER — Other Ambulatory Visit (HOSPITAL_COMMUNITY): Payer: Self-pay

## 2021-07-29 ENCOUNTER — Other Ambulatory Visit (HOSPITAL_COMMUNITY): Payer: Self-pay

## 2021-08-13 ENCOUNTER — Other Ambulatory Visit (HOSPITAL_COMMUNITY): Payer: Self-pay

## 2021-08-14 ENCOUNTER — Other Ambulatory Visit (HOSPITAL_COMMUNITY): Payer: Self-pay

## 2021-08-22 ENCOUNTER — Other Ambulatory Visit (HOSPITAL_COMMUNITY): Payer: Self-pay

## 2021-08-22 NOTE — Progress Notes (Signed)
NEUROLOGY FOLLOW UP OFFICE NOTE  [name] [id number]  Assessment/Plan:   I  Migraine without aura, without status migrainosus, not intractable II  Probable essential tremor, however since it only involves the right hand, will check MRI of brain  MRI of brain with and without contrast Start propranolol ER 60mg  daily to treat tremor and migraines.  We can increase to 80mg  daily in 4 weeks if needed.  Continue nortriptyline 25mg  at bedtime to help with sleep. Migraine rescue:  rizatriptan 10mg .  She will stop sumatriptan 100mg  Limit use of pain relievers to no more than 2 days out of week to prevent risk of rebound or medication-overuse headache. Keep headache diary Follow up 6 months.   Subjective:  Kristina Sherman is a 45 year old woman who follows up for migraine.   UPDATE: Headaches have gotten a little worse, now no longer responding to sumatriptan. Intensity:  moderate-severe Duration:  4-5 hours Frequency:  10 days a month  Over the past 2-3 months, she has noticed a tremor in her right hand.  Noticeable with action or holding objects.  Does not involve the left hand.  No known family history of tremor.  Rescue protocol:  Sumatriptan 100mg  Current NSAIDS:  none Current analgesics:  none Current triptans:  Sumatriptan 100mg  Current ergotamine:  none Current anti-emetic:  none Current muscle relaxants:  none Current anti-anxiolytic:  none Current sleep aide:  none Current Antihypertensive medications:  none Current Antidepressant medications:  nortriptyline 25mg  at bedtime Current Anticonvulsant medications: none Current anti-CGRP:  none Current Vitamins/Herbal/Supplements:  D Current Antihistamines/Decongestants:  none Other therapy:  none Hormone/birth control:  none Other medications:  Synthroid   Caffeine:  2 to 4 cups of tea daily Diet:  2-3 16 oz bottles of water daily Exercise:  yes Depression:  no; Anxiety:  no Other pain:  no Sleep hygiene:   varies   HISTORY:  Onset: Since childhood.  More frequent since around 2015-2016 Location:  left sided from behind left eye to back of head, sometimes right side Quality:  Stabbing needle Initial intensity:  7/10.  She denies new headache, thunderclap headache Aura:  no Premonitory Phase:  no Postdrome:  no Associated symptoms:  When severe there is associated nausea and vomiting.  She denies associated photophobia, phonophobia, visual disturbance, unilateral numbness or weakness. Initial duration:  2 to 5 days. Initial Frequency:  On average it occurs every 6 weeks.  When it occurs, she often will wake up in the middle of the night with it. Initial Frequency of abortive medication: 2 to 5 days every 6 weeks Triggers: Sleep deprivation, jumping, loud talking, cold air Relieving factors: None Activity:  aggravates   Past NSAIDS:  Mobic Past analgesics:  Excedrin Past abortive triptans:  none Past abortive ergotamine:  none Past muscle relaxants:  none Past anti-emetic:  Promethazine 25mg  Past antihypertensive medications:  none Past antidepressant medications:  none Past anticonvulsant medications:    topiramate 50mg  twice daily (dizziness, memory problems) Past anti-CGRP:  none Past vitamins/Herbal/Supplements:  none Past antihistamines/decongestants:  none Other past therapies:  none   Family history of headache:  no  PAST MEDICAL HISTORY: Past Medical History:  Diagnosis Date   Migraines    Thyroid disease     MEDICATIONS: Current Outpatient Medications on File Prior to Visit  Medication Sig Dispense Refill   ibuprofen (ADVIL) 600 MG tablet Take 1 tablet by mouth every 8 (eight) hours as needed.     levothyroxine (SYNTHROID) 112 MCG  tablet Take 1 tablet (112 mcg total) by mouth daily at 6 (six) AM. 90 tablet 3   meloxicam (MOBIC) 7.5 MG tablet Take 1 tablet (7.5 mg total) by mouth daily as needed for pain. 30 tablet 2   nortriptyline (PAMELOR) 25 MG capsule Take 1  capsule (25 mg total) by mouth at bedtime. 90 capsule 3   PFIZER-BIONTECH COVID-19 VACC 30 MCG/0.3ML injection      SUMAtriptan (IMITREX) 100 MG tablet TAKE 1 TAB EARLIEST ONSET OF MIGRAINE. MAY REPEAT IN 2 HOURS IF HEADACHE PERSISTS OR RECURS. MAX 2 TABS/24 HRS 30 tablet 3   Vitamin D, Ergocalciferol, (DRISDOL) 1.25 MG (50000 UT) CAPS capsule Take 1 capsule (50,000 Units total) by mouth every 7 (seven) days. 4 capsule 3   No current facility-administered medications on file prior to visit.    ALLERGIES: No Known Allergies  FAMILY HISTORY: Family History  Problem Relation Age of Onset   Hypertension Mother    Diabetes Father       Objective:  Blood pressure 120/85, pulse 88, height 5\' 2"  (1.575 m), weight 119 lb 12.8 oz (54.3 kg), SpO2 97 %. General: No acute distress.  Patient appears well-groomed.   Head:  Normocephalic/atraumatic Eyes:  Fundi examined but not visualized Neck: supple, no paraspinal tenderness, full range of motion Heart:  Regular rate and rhythm Lungs:  Clear to auscultation bilaterally Back: No paraspinal tenderness Neurological Exam: alert and oriented to person, place, and time.  Speech fluent and not dysarthric, language intact.  CN II-XII intact. Bulk and tone normal, muscle strength 5/5 throughout.  Postural and kinetic tremor in right hand.  No bradykinesia or rigidity.  Sensation to light touch intact.  Deep tendon reflexes 2+ throughout, toes downgoing.  Finger to nose testing intact.  Gait normal, Romberg negative.   , DO

## 2021-08-25 ENCOUNTER — Ambulatory Visit (INDEPENDENT_AMBULATORY_CARE_PROVIDER_SITE_OTHER): Payer: 59 | Admitting: Neurology

## 2021-08-25 ENCOUNTER — Encounter: Payer: Self-pay | Admitting: Neurology

## 2021-08-25 ENCOUNTER — Other Ambulatory Visit: Payer: Self-pay

## 2021-08-25 ENCOUNTER — Other Ambulatory Visit (HOSPITAL_COMMUNITY): Payer: Self-pay

## 2021-08-25 VITALS — BP 120/85 | HR 88 | Ht 62.0 in | Wt 119.8 lb

## 2021-08-25 DIAGNOSIS — R251 Tremor, unspecified: Secondary | ICD-10-CM

## 2021-08-25 DIAGNOSIS — G43009 Migraine without aura, not intractable, without status migrainosus: Secondary | ICD-10-CM | POA: Diagnosis not present

## 2021-08-25 MED ORDER — RIZATRIPTAN BENZOATE 10 MG PO TBDP
10.0000 mg | ORAL_TABLET | ORAL | 5 refills | Status: DC | PRN
  Filled 2021-08-25: qty 10, 30d supply, fill #0
  Filled 2021-12-03: qty 10, 30d supply, fill #1
  Filled 2022-01-09: qty 10, 30d supply, fill #2
  Filled 2022-02-18: qty 10, 30d supply, fill #3
  Filled 2022-03-11: qty 10, 30d supply, fill #4

## 2021-08-25 MED ORDER — PROPRANOLOL HCL ER 60 MG PO CP24
60.0000 mg | ORAL_CAPSULE | Freq: Every day | ORAL | 5 refills | Status: DC
  Filled 2021-08-25: qty 30, 30d supply, fill #0

## 2021-08-25 NOTE — Patient Instructions (Signed)
Will check MRI of brain with and without contrast Start propranolol ER 60mg  daily for headache and tremor.  If no improvement in headaches in 4 weeks, contact me and we can increase dose. Stop sumatriptan.  Take rizatriptan.  May repeat once in 2 hours.   Follow up 6 months.

## 2021-08-26 ENCOUNTER — Encounter: Payer: Self-pay | Admitting: Neurology

## 2021-09-22 ENCOUNTER — Ambulatory Visit: Payer: 59 | Admitting: Obstetrics & Gynecology

## 2021-09-26 ENCOUNTER — Other Ambulatory Visit: Payer: Self-pay

## 2021-09-26 ENCOUNTER — Ambulatory Visit
Admission: RE | Admit: 2021-09-26 | Discharge: 2021-09-26 | Disposition: A | Discharge status: Home / Self Care | Payer: 59 | Source: Ambulatory Visit | Attending: Neurology | Admitting: Neurology

## 2021-09-26 DIAGNOSIS — R251 Tremor, unspecified: Secondary | ICD-10-CM

## 2021-09-26 DIAGNOSIS — R519 Headache, unspecified: Secondary | ICD-10-CM | POA: Diagnosis not present

## 2021-09-26 DIAGNOSIS — G43009 Migraine without aura, not intractable, without status migrainosus: Secondary | ICD-10-CM

## 2021-09-26 MED ORDER — GADOBENATE DIMEGLUMINE 529 MG/ML IV SOLN
11.0000 mL | Freq: Once | INTRAVENOUS | Status: AC | PRN
  Administered 2021-09-26: 11 mL via INTRAVENOUS

## 2021-09-30 ENCOUNTER — Telehealth: Payer: Self-pay | Admitting: Neurology

## 2021-09-30 NOTE — Telephone Encounter (Signed)
Pt is requesting results from her MRI. Said she needs a call to go over them.

## 2021-09-30 NOTE — Telephone Encounter (Signed)
Patient advised and voiced understanding of MRI results.  

## 2021-10-01 ENCOUNTER — Other Ambulatory Visit (HOSPITAL_COMMUNITY): Payer: Self-pay

## 2021-10-08 ENCOUNTER — Ambulatory Visit: Payer: 59 | Admitting: Obstetrics & Gynecology

## 2021-10-28 ENCOUNTER — Encounter: Payer: Self-pay | Admitting: Neurology

## 2021-11-05 ENCOUNTER — Encounter: Payer: Self-pay | Admitting: Obstetrics & Gynecology

## 2021-11-05 ENCOUNTER — Ambulatory Visit (INDEPENDENT_AMBULATORY_CARE_PROVIDER_SITE_OTHER): Payer: 59 | Admitting: Obstetrics & Gynecology

## 2021-11-05 ENCOUNTER — Other Ambulatory Visit: Payer: Self-pay

## 2021-11-05 VITALS — BP 111/76 | HR 87 | Resp 16 | Ht 61.0 in | Wt 120.0 lb

## 2021-11-05 DIAGNOSIS — Z01419 Encounter for gynecological examination (general) (routine) without abnormal findings: Secondary | ICD-10-CM | POA: Diagnosis not present

## 2021-11-05 NOTE — Progress Notes (Signed)
Last pap 6/21 

## 2021-11-05 NOTE — Progress Notes (Signed)
Subjective:     Kristina Sherman is a 45 y.o. female here for a routine exam.  Current complaints: pt reports monthly cycles with the Paragard. Her main bleeding is 4-5 days with dark brown staining before and after her cycle. She denies pain or other sx. Her Paragard is 43 years old. She denies other sx.       Gynecologic History No LMP recorded. (Menstrual status: IUD). Contraception: IUD Last Pap: 05/22/2020. Results were: normal Last mammogram: 04/08/2021. Results were: normal  Obstetric History OB History  Gravida Para Term Preterm AB Living  2 2 2     2   SAB IAB Ectopic Multiple Live Births          2    # Outcome Date GA Lbr Len/2nd Weight Sex Delivery Anes PTL Lv  2 Term 2013 [redacted]w[redacted]d   F CS-LTranv Spinal N LIV  1 Term 2007 [redacted]w[redacted]d   M CS-LTranv EPI N LIV    The following portions of the patient's history were reviewed and updated as appropriate: allergies, current medications, past family history, past medical history, past social history, past surgical history, and problem list.  Review of Systems Pertinent items are noted in HPI.    Objective:  BP 111/76   Pulse 87   Resp 16   Ht 5\' 1"  (1.549 m)   Wt 120 lb (54.4 kg)   BMI 22.67 kg/m   General Appearance:    Alert, cooperative, no distress, appears stated age  Head:    Normocephalic, without obvious abnormality, atraumatic  Eyes:    conjunctiva/corneas clear, EOM's intact, both eyes  Ears:    Normal external ear canals, both ears  Nose:   Nares normal, septum midline, mucosa normal, no drainage    or sinus tenderness  Throat:   Lips, mucosa, and tongue normal; teeth and gums normal  Neck:   Supple, symmetrical, trachea midline, no adenopathy;    thyroid:  no enlargement/tenderness/nodules  Back:     Symmetric, no curvature, ROM normal, no CVA tenderness  Lungs:     respirations unlabored  Chest Wall:    No tenderness or deformity   Heart:    Regular rate and rhythm  Breast Exam:    No tenderness, masses, or nipple  abnormality  Abdomen:     Soft, non-tender, bowel sounds active all four quadrants,    no masses, no organomegaly  Genitalia:    Normal female without lesion, discharge or tenderness   IUD strings noted.   Extremities:   Extremities normal, atraumatic, no cyanosis or edema  Pulses:   2+ and symmetric all extremities  Skin:   Skin color, texture, turgor normal, no rashes or lesions     Assessment:    Healthy female exam.  IUD in place. For replacement 2024 PAP due 2024   Plan:   Kristina Sherman was seen today for gynecologic exam.  Diagnoses and all orders for this visit:  Well female exam with routine gynecological exam  F/u in 1 year or sooner prn  PAP 2024 Mammogram 04/2022  Branden Shallenberger L. Harraway-Smith, M.D., 2025

## 2021-11-14 ENCOUNTER — Other Ambulatory Visit (HOSPITAL_COMMUNITY): Payer: Self-pay

## 2021-12-04 ENCOUNTER — Other Ambulatory Visit (HOSPITAL_COMMUNITY): Payer: Self-pay

## 2021-12-30 ENCOUNTER — Other Ambulatory Visit (HOSPITAL_COMMUNITY): Payer: Self-pay

## 2022-01-09 ENCOUNTER — Encounter: Payer: Self-pay | Admitting: Podiatry

## 2022-01-09 ENCOUNTER — Emergency Department: Admission: EM | Admit: 2022-01-09 | Discharge: 2022-01-09 | Discharge status: Against Medical Advice | Payer: Self-pay

## 2022-01-09 ENCOUNTER — Ambulatory Visit (INDEPENDENT_AMBULATORY_CARE_PROVIDER_SITE_OTHER): Payer: 59 | Admitting: Podiatry

## 2022-01-09 ENCOUNTER — Other Ambulatory Visit: Payer: Self-pay

## 2022-01-09 ENCOUNTER — Other Ambulatory Visit (HOSPITAL_COMMUNITY): Payer: Self-pay

## 2022-01-09 DIAGNOSIS — S91209A Unspecified open wound of unspecified toe(s) with damage to nail, initial encounter: Secondary | ICD-10-CM | POA: Diagnosis not present

## 2022-01-09 DIAGNOSIS — L603 Nail dystrophy: Secondary | ICD-10-CM

## 2022-01-09 NOTE — Patient Instructions (Signed)

## 2022-01-09 NOTE — Progress Notes (Signed)
°  Subjective:  Patient ID: Kristina Sherman, female    DOB: May 25, 1976,   MRN: [id number]  Chief Complaint  Patient presents with   Toe Pain     left great toe pain     46 y.o. female presents for concern of left great toe pain. Relates yesterday she noticed the nail was loose and coming off and painful. Does not recall a specific injury but may have pulled her leg back at some point and pulled the nail. She was going to be seen in urgent care but was sent down here to be evaluated.. Denies any other pedal complaints. Denies n/v/f/c.   Past Medical History:  Diagnosis Date   Migraines    Thyroid disease     Objective:  Physical Exam: Vascular: DP/PT pulses 2/4 bilateral. CFT <3 seconds. Normal hair growth on digits. No edema.  Skin. No lacerations or abrasions bilateral feet. Left hallux nail thickened and discolored and detached from nail bed at distal end. Some bleeding noted surrounding nail. No erythema or edema noted.  Musculoskeletal: MMT 5/5 bilateral lower extremities in DF, PF, Inversion and Eversion. Deceased ROM in DF of ankle joint.  Neurological: Sensation intact to light touch.   Assessment:   1. Onychodystrophy   2. Traumatic avulsion of nail plate of toe, initial encounter      Plan:  Patient was evaluated and treated and all questions answered. Patient requesting removal of ingrown nail today. Procedure below.  Discussed procedure and post procedure care and patient expressed understanding.  Will follow-up in 2 weeks for nail check or sooner if any problems arise.    Procedure:  Procedure: total Nail Avulsion of left hallux  Surgeon: Louann Sjogren, DPM  Pre-op Dx: Ingrown toenail without infection Post-op: Same  Place of Surgery: Office exam room.  Indications for surgery: Painful and ingrown toenail.    The patient is requesting removal of nail without chemical matrixectomy. Risks and complications were discussed with the patient for which they  understand and written consent was obtained. Under sterile conditions a total of 3 mL of  1% lidocaine plain was infiltrated in a hallux block fashion. Once anesthetized, the skin was prepped in sterile fashion. A tourniquet was then applied. Next the left hallux nail was removed all the way back to the nail matrix. Area copiously irrigated. Silvadene was applied. A dry sterile dressing was applied. After application of the dressing the tourniquet was removed and there is found to be an immediate capillary refill time to the digit. The patient tolerated the procedure well without any complications. Post procedure instructions were discussed the patient for which he verbally understood. Follow-up in two weeks for nail check or sooner if any problems are to arise. Discussed signs/symptoms of infection and directed to call the office immediately should any occur or go directly to the emergency room. In the meantime, encouraged to call the office with any questions, concerns, changes symptoms.   Louann Sjogren, DPM

## 2022-01-23 ENCOUNTER — Other Ambulatory Visit: Payer: Self-pay

## 2022-01-23 ENCOUNTER — Ambulatory Visit (INDEPENDENT_AMBULATORY_CARE_PROVIDER_SITE_OTHER): Payer: 59 | Admitting: Podiatry

## 2022-01-23 ENCOUNTER — Encounter: Payer: Self-pay | Admitting: Podiatry

## 2022-01-23 DIAGNOSIS — S91209D Unspecified open wound of unspecified toe(s) with damage to nail, subsequent encounter: Secondary | ICD-10-CM

## 2022-01-23 DIAGNOSIS — L603 Nail dystrophy: Secondary | ICD-10-CM

## 2022-01-23 NOTE — Progress Notes (Signed)
°  Subjective:  Patient ID: ELOUISE DIVELBISS, female    DOB: [birth year],   MRN: [id number]  No chief complaint on file.   46 y.o. female presents for follow-up of left great toe total nail avulsion. Patient relates she is doing well has been doing soaks as instructed.  . Denies any other pedal complaints. Denies n/v/f/c.   Past Medical History:  Diagnosis Date   Migraines    Thyroid disease     Objective:  Physical Exam: Vascular: DP/PT pulses 2/4 bilateral. CFT <3 seconds. Normal hair growth on digits. No edema.  Skin. No lacerations or abrasions bilateral feet. Left hallux nail bed healing well no signs of infection  Musculoskeletal: MMT 5/5 bilateral lower extremities in DF, PF, Inversion and Eversion. Deceased ROM in DF of ankle joint.  Neurological: Sensation intact to light touch.   Assessment:   1. Onychodystrophy   2. Traumatic avulsion of nail plate of toe, subsequent encounter      Plan:  Patient was evaluated and treated and all questions answered. Toe was evaluated and appears to be healing well.  May discontinue soaks and neosporin.  Patient to follow-up as needed.    Louann Sjogren, DPM

## 2022-02-18 ENCOUNTER — Other Ambulatory Visit (HOSPITAL_COMMUNITY): Payer: Self-pay

## 2022-02-20 ENCOUNTER — Ambulatory Visit: Payer: 59 | Admitting: Medical

## 2022-03-04 NOTE — Progress Notes (Deleted)
? ?NEUROLOGY FOLLOW UP OFFICE NOTE ? ?Kristina Sherman ?[id number] ? ?Assessment/Plan:  ? ?I  Migraine without aura, without status migrainosus, not intractable ?II  Probable essential tremor, however since it only involves the right hand, will check MRI of brain ?  ?MRI of brain with and without contrast ?Start propranolol ER 60mg  daily to treat tremor and migraines.  We can increase to 80mg  daily in 4 weeks if needed.  Continue nortriptyline 25mg  at bedtime to help with sleep. ?Migraine rescue:  rizatriptan 10mg .  She will stop sumatriptan 100mg  ?Limit use of pain relievers to no more than 2 days out of week to prevent risk of rebound or medication-overuse headache. ?Keep headache diary ?Follow up 6 months. ?  ?  ?Subjective:  ?Kristina Sherman is a 46 year old woman who follows up for migraine. ?  ?UPDATE: ?Due to new onset right hand tremor, she had MRI brain with and without contrast on 09/26/2021 which was personally reviewed and was normal. ? ?We switched from sumatriptan to rizatriptan but stated that it aggravated her tremor.  I had her try Nurtec ***  She was started on propranolol to treat both tremor and worsening headaches. ?Intensity:  moderate-severe ?Duration:  4-5 hours ?Frequency:  10 days a month ?  ? ?  ?Rescue protocol:  *** ?Current NSAIDS:  none ?Current analgesics:  none ?Current triptans:  *** ?Current ergotamine:  none ?Current anti-emetic:  none ?Current muscle relaxants:  none ?Current anti-anxiolytic:  none ?Current sleep aide:  none ?Current Antihypertensive medications:  propranolol ER 60mg  daily ?Current Antidepressant medications:  nortriptyline 25mg  at bedtime ?Current Anticonvulsant medications: none ?Current anti-CGRP:  none ?Current Vitamins/Herbal/Supplements:  D ?Current Antihistamines/Decongestants:  none ?Other therapy:  none ?Hormone/birth control:  none ?Other medications:  Synthroid ?  ?Caffeine:  2 to 4 cups of tea daily ?Diet:  2-3 16 oz bottles of water daily ?Exercise:   yes ?Depression:  no; Anxiety:  no ?Other pain:  no ?Sleep hygiene:  varies ?  ?HISTORY:  ?Migraine: ?Onset: Since childhood.  More frequent since around 2015-2016 ?Location:  left sided from behind left eye to back of head, sometimes right side ?Quality:  Stabbing needle ?Initial intensity:  7/10.  She denies new headache, thunderclap headache ?Aura:  no ?Premonitory Phase:  no ?Postdrome:  no ?Associated symptoms:  When severe there is associated nausea and vomiting.  She denies associated photophobia, phonophobia, visual disturbance, unilateral numbness or weakness. ?Initial duration:  2 to 5 days. ?Initial Frequency:  On average it occurs every 6 weeks.  When it occurs, she often will wake up in the middle of the night with it. ?Initial Frequency of abortive medication: 2 to 5 days every 6 weeks ?Triggers: Sleep deprivation, jumping, loud talking, cold air ?Relieving factors: None ?Activity:  aggravates ?  ?Tremor: ?In 2022, she has noticed a tremor in her right hand.  Noticeable with action or holding objects.  Does not involve the left hand.  No known family history of tremor. ? ?Past NSAIDS:  Mobic ?Past analgesics:  Excedrin ?Past abortive triptans:  sumatriptan 100mg , rizatriptan 10mg  ?Past abortive ergotamine:  none ?Past muscle relaxants:  none ?Past anti-emetic:  Promethazine 25mg  ?Past antihypertensive medications:  none ?Past antidepressant medications:  none ?Past anticonvulsant medications:    topiramate 50mg  twice daily (dizziness, memory problems) ?Past anti-CGRP:  none ?Past vitamins/Herbal/Supplements:  none ?Past antihistamines/decongestants:  none ?Other past therapies:  none ?  ?Family history of headache:  no ? ?PAST MEDICAL HISTORY: ?Past Medical  History:  ?Diagnosis Date  ? Migraines   ? Thyroid disease   ? ? ?MEDICATIONS: ?Current Outpatient Medications on File Prior to Visit  ?Medication Sig Dispense Refill  ? ibuprofen (ADVIL) 600 MG tablet Take 1 tablet by mouth every 8 (eight) hours as  needed.    ? levothyroxine (SYNTHROID) 112 MCG tablet Take 1 tablet (112 mcg total) by mouth daily at 6 (six) AM. 90 tablet 3  ? meloxicam (MOBIC) 7.5 MG tablet Take 1 tablet (7.5 mg total) by mouth daily as needed for pain. 30 tablet 2  ? nortriptyline (PAMELOR) 25 MG capsule Take 1 capsule (25 mg total) by mouth at bedtime. 90 capsule 3  ? PARAGARD INTRAUTERINE COPPER IU by Intrauterine route.    ? PFIZER-BIONTECH COVID-19 VACC 30 MCG/0.3ML injection     ? propranolol ER (INDERAL LA) 60 MG 24 hr capsule Take 1 capsule (60 mg total) by mouth daily. 30 capsule 5  ? rizatriptan (MAXALT-MLT) 10 MG disintegrating tablet Take 1 tablet (10 mg total) by mouth as needed for migraine (May repeat in 2 hours if needed. Maximum 2 tablets in 24 hours.) 10 tablet 5  ? Vitamin D, Ergocalciferol, (DRISDOL) 1.25 MG (50000 UT) CAPS capsule Take 1 capsule (50,000 Units total) by mouth every 7 (seven) days. 4 capsule 3  ? ?No current facility-administered medications on file prior to visit.  ? ? ?ALLERGIES: ?No Known Allergies ? ?FAMILY HISTORY: ?Family History  ?Problem Relation Age of Onset  ? Hypertension Mother   ? Diabetes Father   ? ? ?  ?Objective:  ?*** ?General: No acute distress.  Patient appears ***-groomed.   ?Head:  Normocephalic/atraumatic ?Eyes:  Fundi examined but not visualized ?Neck: supple, no paraspinal tenderness, full range of motion ?Heart:  Regular rate and rhythm ?Lungs:  Clear to auscultation bilaterally ?Back: No paraspinal tenderness ?Neurological Exam: alert and oriented to person, place, and time.  Speech fluent and not dysarthric, language intact.  CN II-XII intact. Bulk and tone normal, muscle strength 5/5 throughout.  Sensation to light touch intact.  Deep tendon reflexes 2+ throughout, toes downgoing.  Finger to nose testing intact.  Gait normal, Romberg negative. ? ? ?Metta Clines, DO ? ?CC: *** ? ? ? ? ? ? ?

## 2022-03-05 ENCOUNTER — Ambulatory Visit: Payer: 59 | Admitting: Neurology

## 2022-03-10 ENCOUNTER — Ambulatory Visit: Payer: 59 | Admitting: Medical

## 2022-03-10 ENCOUNTER — Encounter: Payer: Self-pay | Admitting: Medical

## 2022-03-10 VITALS — BP 107/70 | HR 74 | Temp 97.5°F | Resp 18 | Ht 62.0 in | Wt 123.0 lb

## 2022-03-10 DIAGNOSIS — E559 Vitamin D deficiency, unspecified: Secondary | ICD-10-CM

## 2022-03-10 DIAGNOSIS — Z0000 Encounter for general adult medical examination without abnormal findings: Secondary | ICD-10-CM

## 2022-03-10 DIAGNOSIS — D649 Anemia, unspecified: Secondary | ICD-10-CM | POA: Diagnosis not present

## 2022-03-10 DIAGNOSIS — E039 Hypothyroidism, unspecified: Secondary | ICD-10-CM | POA: Diagnosis not present

## 2022-03-10 LAB — CBC WITH DIFFERENTIAL/PLATELET
Basophils Absolute: 0.1 10*3/uL (ref 0.0–0.1)
Basophils Relative: 1 % (ref 0.0–3.0)
Eosinophils Absolute: 0.4 10*3/uL (ref 0.0–0.7)
Eosinophils Relative: 6.3 % — ABNORMAL HIGH (ref 0.0–5.0)
HCT: 34.9 % — ABNORMAL LOW (ref 36.0–46.0)
Hemoglobin: 11.7 g/dL — ABNORMAL LOW (ref 12.0–15.0)
Lymphocytes Relative: 23.2 % (ref 12.0–46.0)
Lymphs Abs: 1.4 10*3/uL (ref 0.7–4.0)
MCHC: 33.6 g/dL (ref 30.0–36.0)
MCV: 80.7 fl (ref 78.0–100.0)
Monocytes Absolute: 0.7 10*3/uL (ref 0.1–1.0)
Monocytes Relative: 11.1 % (ref 3.0–12.0)
Neutro Abs: 3.6 10*3/uL (ref 1.4–7.7)
Neutrophils Relative %: 58.4 % (ref 43.0–77.0)
Platelets: 294 10*3/uL (ref 150.0–400.0)
RBC: 4.32 Mil/uL (ref 3.87–5.11)
RDW: 14.1 % (ref 11.5–15.5)
WBC: 6.1 10*3/uL (ref 4.0–10.5)

## 2022-03-10 LAB — LIPID PANEL
Cholesterol: 146 mg/dL (ref 0–200)
HDL: 42.5 mg/dL (ref >39.00)
LDL Cholesterol: 94 mg/dL (ref 0–99)
NonHDL: 103.01
Total CHOL/HDL Ratio: 3
Triglycerides: 46 mg/dL (ref 0.0–149.0)
VLDL: 9.2 mg/dL (ref 0.0–40.0)

## 2022-03-10 LAB — COMPREHENSIVE METABOLIC PANEL
ALT: 13 U/L (ref 0–35)
AST: 31 U/L (ref 0–37)
Albumin: 4.2 g/dL (ref 3.5–5.2)
Alkaline Phosphatase: 63 U/L (ref 39–117)
BUN: 9 mg/dL (ref 6–23)
CO2: 31 mEq/L (ref 19–32)
Calcium: 9.5 mg/dL (ref 8.4–10.5)
Chloride: 102 mEq/L (ref 96–112)
Creatinine, Ser: 0.87 mg/dL (ref 0.40–1.20)
GFR: 81.48 mL/min (ref >60.00)
Glucose, Bld: 78 mg/dL (ref 70–99)
Potassium: 5.8 mEq/L — ABNORMAL HIGH (ref 3.5–5.1)
Sodium: 138 mEq/L (ref 135–145)
Total Bilirubin: 0.7 mg/dL (ref 0.2–1.2)
Total Protein: 7.3 g/dL (ref 6.0–8.3)

## 2022-03-10 LAB — VITAMIN D 25 HYDROXY (VIT D DEFICIENCY, FRACTURES): VITD: 43.88 ng/mL (ref 30.00–100.00)

## 2022-03-10 LAB — TSH: TSH: 1.95 u[IU]/mL (ref 0.35–5.50)

## 2022-03-10 LAB — T4, FREE: Free T4: 1.34 ng/dL (ref 0.60–1.60)

## 2022-03-10 NOTE — Patient Instructions (Signed)
For you wellness exam today I have ordered cbc, cmp and  lipid panel. ? ?Vaccine appear up to date. ? ?Recommend  continue exercise and healthy diet. ? ?We will let you know lab results as they come in. ? ?Follow up date appointment will be determined after lab review.   ? ?For vit d deficiency will get vit D level. ? ?Also for  hypothyroid tsh and t4. ? ?Follow up date to be determined after lab review. ?

## 2022-03-10 NOTE — Progress Notes (Signed)
? ?Subjective:  ? ? Patient ID: Kristina Sherman, female    DOB: Oct 12, 1976, 46 y.o.   MRN: [id number] ? ?HPI ? ?Pt in for first time. ? ?Pt is fasting. Decided to go ahead and do wellness exam.  ? ?Pt had worked as Runner, broadcasting/film/video in past. Pt walks 3 days a week. Pt diet is healthy. Pt drinks tea 2-3 times. Non smoker. No alcohol.  ? ?Last pap 05-22-2020. ? ?[facility] mammogram- 04-08-2021. ? ?Pt had 3 or 4 covid vaccines. ? ? ? ? ? ?Review of Systems  ?Constitutional:  Negative for chills, fatigue and fever.  ?HENT:  Negative for congestion, ear discharge and ear pain.   ?Respiratory:  Negative for cough, chest tightness, shortness of breath and wheezing.   ?Cardiovascular:  Negative for chest pain and palpitations.  ?Gastrointestinal:  Negative for abdominal pain, constipation, diarrhea and nausea.  ?Genitourinary:  Negative for dyspareunia, dysuria, flank pain, frequency and urgency.  ?Musculoskeletal:  Negative for back pain and gait problem.  ?Skin:  Negative for rash.  ?Neurological:  Negative for dizziness, speech difficulty, weakness, numbness and headaches.  ?Hematological:  Negative for adenopathy. Does not bruise/bleed easily.  ?Psychiatric/Behavioral:  Negative for behavioral problems, decreased concentration and dysphoric mood.   ? ? ?Past Medical History:  ?Diagnosis Date  ? Migraines   ? Thyroid disease   ? ?  ?Social History  ? ?Socioeconomic History  ? Marital status: Married  ?  Spouse name: Not on file  ? Number of children: 2  ? Years of education: Not on file  ? Highest education level: Not on file  ?Occupational History  ? Occupation: uemployed  ?Tobacco Use  ? Smoking status: Never  ? Smokeless tobacco: Never  ?Vaping Use  ? Vaping Use: Never used  ?Substance and Sexual Activity  ? Alcohol use: Never  ? Drug use: Never  ? Sexual activity: Yes  ?  Birth control/protection: I.U.D.  ?Other Topics Concern  ? Not on file  ?Social History Narrative  ? Patient is right-handed. Patient lives with her husband and 2  children in 2 story home. She drinks 3 cups of tea a day. She exercise daily, yoga and walking.  ? ?Social Determinants of Health  ? ?Financial Resource Strain: Not on file  ?Food Insecurity: Not on file  ?Transportation Needs: Not on file  ?Physical Activity: Not on file  ?Stress: Not on file  ?Social Connections: Not on file  ?Intimate Partner Violence: Not on file  ? ? ?Past Surgical History:  ?Procedure Laterality Date  ? CESAREAN SECTION    ? ? ?Family History  ?Problem Relation Age of Onset  ? Hypertension Mother   ? Diabetes Father   ? ? ?No Known Allergies ? ?Current Outpatient Medications on File Prior to Visit  ?Medication Sig Dispense Refill  ? ibuprofen (ADVIL) 600 MG tablet Take 1 tablet by mouth every 8 (eight) hours as needed.    ? levothyroxine (SYNTHROID) 112 MCG tablet Take 1 tablet (112 mcg total) by mouth daily at 6 (six) AM. 90 tablet 3  ? nortriptyline (PAMELOR) 25 MG capsule Take 1 capsule (25 mg total) by mouth at bedtime. 90 capsule 3  ? PARAGARD INTRAUTERINE COPPER IU by Intrauterine route.    ? PFIZER-BIONTECH COVID-19 VACC 30 MCG/0.3ML injection     ? rizatriptan (MAXALT-MLT) 10 MG disintegrating tablet Take 1 tablet (10 mg total) by mouth as needed for migraine (May repeat in 2 hours if needed. Maximum 2 tablets in 24  hours.) 10 tablet 5  ? Vitamin D, Ergocalciferol, (DRISDOL) 1.25 MG (50000 UT) CAPS capsule Take 1 capsule (50,000 Units total) by mouth every 7 (seven) days. 4 capsule 3  ? ?No current facility-administered medications on file prior to visit.  ? ? ?BP 107/70   Pulse 74   Temp (!) 97.5 ?F (36.4 ?C)   Resp 18   Ht 5\' 2"  (1.575 m)   Wt 123 lb (55.8 kg)   SpO2 100%   BMI 22.50 kg/m?  ?  ?   ?Objective:  ? Physical Exam ? ?General ?Mental Status- Alert. General Appearance- Not in acute distress.  ? ?Skin ?General: Color- Normal Color. Moisture- Normal Moisture. ? ?Neck ?Carotid Arteries- Normal color. Moisture- Normal Moisture. No carotid bruits. No JVD. ? ?Chest and  Lung Exam ?Auscultation: ?Breath Sounds:-Normal. ? ?Cardiovascular ?Auscultation:Rythm- Regular. ?Murmurs & Other Heart Sounds:Auscultation of the heart reveals- No Murmurs. ? ?Abdomen ?Inspection:-Inspeection Normal. ?Palpation/Percussion:Note:No mass. Palpation and Percussion of the abdomen reveal- Non Tender, Non Distended + BS, no rebound or guarding. ? ? ?Neurologic ?Cranial Nerve exam:- CN III-XII intact(No nystagmus), symmetric smile. ?Strength:- 5/5 equal and symmetric strength both upper and lower extremities.  ? ? ?   ?Assessment & Plan:  ? ?Patient Instructions  ?For you wellness exam today I have ordered cbc, cmp and  lipid panel. ? ?Vaccine appear up to date. ? ?Recommend  continue exercise and healthy diet. ? ?We will let you know lab results as they come in. ? ?Follow up date appointment will be determined after lab review.   ? ?For vit d deficiency will get vit D level. ? ?Also for  hypothyroid tsh and t4. ? ?Follow up date to be determined after lab review.  ?

## 2022-03-11 ENCOUNTER — Telehealth: Payer: Self-pay | Admitting: Medical

## 2022-03-11 ENCOUNTER — Other Ambulatory Visit (HOSPITAL_COMMUNITY): Payer: Self-pay

## 2022-03-11 ENCOUNTER — Encounter: Payer: Self-pay | Admitting: Medical

## 2022-03-11 DIAGNOSIS — E875 Hyperkalemia: Secondary | ICD-10-CM

## 2022-03-11 DIAGNOSIS — E039 Hypothyroidism, unspecified: Secondary | ICD-10-CM

## 2022-03-11 MED ORDER — SODIUM POLYSTYRENE SULFONATE 15 GM/60ML PO SUSP
15.0000 g | Freq: Every day | ORAL | 0 refills | Status: AC
  Filled 2022-03-11: qty 240, 4d supply, fill #0

## 2022-03-11 NOTE — Addendum Note (Signed)
Addended by: Gwenevere Abbot on: 03/11/2022 07:55 PM ? ? Modules accepted: Orders ? ?

## 2022-03-11 NOTE — Telephone Encounter (Signed)
Future cmp placed. 

## 2022-03-12 ENCOUNTER — Other Ambulatory Visit (HOSPITAL_COMMUNITY): Payer: Self-pay

## 2022-03-12 ENCOUNTER — Other Ambulatory Visit (INDEPENDENT_AMBULATORY_CARE_PROVIDER_SITE_OTHER): Payer: 59

## 2022-03-12 DIAGNOSIS — E875 Hyperkalemia: Secondary | ICD-10-CM | POA: Diagnosis not present

## 2022-03-12 DIAGNOSIS — D649 Anemia, unspecified: Secondary | ICD-10-CM

## 2022-03-12 NOTE — Addendum Note (Signed)
Addended by: Rosita Kea on: 03/12/2022 09:55 AM ? ? Modules accepted: Orders ? ?

## 2022-03-12 NOTE — Addendum Note (Signed)
Addended by: Gwenevere Abbot on: 03/12/2022 08:37 AM ? ? Modules accepted: Orders ? ?

## 2022-03-13 LAB — COMPREHENSIVE METABOLIC PANEL
AG Ratio: 1.4 (calc) (ref 1.0–2.5)
ALT: 9 U/L (ref 6–29)
AST: 26 U/L (ref 10–30)
Albumin: 4.1 g/dL (ref 3.6–5.1)
Alkaline phosphatase (APISO): 65 U/L (ref 31–125)
BUN: 10 mg/dL (ref 7–25)
CO2: 26 mmol/L (ref 20–32)
Calcium: 9.5 mg/dL (ref 8.6–10.2)
Chloride: 101 mmol/L (ref 98–110)
Creat: 0.96 mg/dL (ref 0.50–0.99)
Globulin: 3 g/dL (calc) (ref 1.9–3.7)
Glucose, Bld: 96 mg/dL (ref 65–99)
Potassium: 5.3 mmol/L (ref 3.5–5.3)
Sodium: 136 mmol/L (ref 135–146)
Total Bilirubin: 0.6 mg/dL (ref 0.2–1.2)
Total Protein: 7.1 g/dL (ref 6.1–8.1)

## 2022-03-13 LAB — IRON,TIBC AND FERRITIN PANEL
%SAT: 27 % (calc) (ref 16–45)
Ferritin: 17 ng/mL (ref 16–232)
Iron: 95 ug/dL (ref 40–190)
TIBC: 351 mcg/dL (calc) (ref 250–450)

## 2022-03-16 ENCOUNTER — Other Ambulatory Visit (HOSPITAL_COMMUNITY): Payer: Self-pay

## 2022-03-18 ENCOUNTER — Other Ambulatory Visit (HOSPITAL_COMMUNITY): Payer: Self-pay

## 2022-03-27 ENCOUNTER — Other Ambulatory Visit (HOSPITAL_BASED_OUTPATIENT_CLINIC_OR_DEPARTMENT_OTHER): Payer: Self-pay | Admitting: Medical

## 2022-03-27 DIAGNOSIS — Z1231 Encounter for screening mammogram for malignant neoplasm of breast: Secondary | ICD-10-CM

## 2022-04-01 ENCOUNTER — Other Ambulatory Visit: Payer: Self-pay

## 2022-04-03 ENCOUNTER — Other Ambulatory Visit (HOSPITAL_COMMUNITY): Payer: Self-pay

## 2022-04-06 ENCOUNTER — Other Ambulatory Visit (HOSPITAL_COMMUNITY): Payer: Self-pay

## 2022-04-06 MED ORDER — LEVOTHYROXINE SODIUM 112 MCG PO TABS
112.0000 ug | ORAL_TABLET | Freq: Every day | ORAL | 3 refills | Status: DC
  Filled 2022-04-06 (×2): qty 90, 90d supply, fill #0
  Filled 2022-07-13: qty 90, 90d supply, fill #1
  Filled 2022-10-07: qty 90, 90d supply, fill #2
  Filled 2023-01-08: qty 90, 90d supply, fill #3

## 2022-04-06 NOTE — Progress Notes (Signed)
? ?NEUROLOGY FOLLOW UP OFFICE NOTE ? ?Kristina Sherman ?[id number] ? ?Assessment/Plan:  ? ?I  Migraine without aura, without status migrainosus, not intractable ?II  Probable essential tremor - infrequent and not requiring propranolol or other medication at this time. ?  ?Nortriptyline 25mg  at bedtime ?Rizatriptan 10mg  for rescue therapy ?If she is performing an activity that causes neck discomfort, will have her take cyclobenzaprine 10mg  to prevent progression to migraine.  Cautioned her regarding drowsiness ?Follow up 9 months. ?  ?  ?Subjective:  ?Kristina Sherman is a 46 year old woman who follows up for migraine. ?  ?UPDATE: ?Last year, she has noticed a tremor in her right hand.  Noticeable with action or holding objects.  Does not involve the left hand.  No known family history of tremor.  MRI of brain with and without contrast on 09/26/2021 personally reviewed was normal.  Started propranolol to treat with migraine and tremor.  However, she never started it.  However tremor not too bad.  Only notices it if she is stressed.  Nurtec ineffective for rescue.  Rizatriptan works.  If she is doing any activity where she has her neck flexed looking down, it may trigger a headache. ? ?Intensity:  moderate-severe ?Duration:  brief with rizatritan. ?Frequency:  1 day in April ?  ?Rescue protocol:  rizatriptan ?Current NSAIDS:  none ?Current analgesics:  none ?Current triptans: rizatriptan 10mg  ?Current ergotamine:  none ?Current anti-emetic:  none ?Current muscle relaxants:  none ?Current anti-anxiolytic:  none ?Current sleep aide:  none ?Current Antihypertensive medications:  propranolol ER 60mg  daily ?Current Antidepressant medications:  nortriptyline 25mg  at bedtime ?Current Anticonvulsant medications: none ?Current anti-CGRP:  none ?Current Vitamins/Herbal/Supplements:  D ?Current Antihistamines/Decongestants:  none ?Other therapy:  none ?Hormone/birth control:  none ?Other medications:  Synthroid ?  ?Caffeine:  2 to 4  cups of tea daily ?Diet:  2-3 16 oz bottles of water daily ?Exercise:  yes ?Depression:  no; Anxiety:  no ?Other pain:  no ?Sleep hygiene:  varies ?  ?HISTORY:  ?Onset: Since childhood.  More frequent since around 2015-2016 ?Location:  left sided from behind left eye to back of head, sometimes right side ?Quality:  Stabbing needle ?Initial intensity:  7/10.  She denies new headache, thunderclap headache ?Aura:  no ?Premonitory Phase:  no ?Postdrome:  no ?Associated symptoms:  When severe there is associated nausea and vomiting.  She denies associated photophobia, phonophobia, visual disturbance, unilateral numbness or weakness. ?Initial duration:  2 to 5 days. ?Initial Frequency:  On average it occurs every 6 weeks.  When it occurs, she often will wake up in the middle of the night with it. ?Initial Frequency of abortive medication: 2 to 5 days every 6 weeks ?Triggers: Sleep deprivation, jumping, loud talking, cold air ?Relieving factors: None ?Activity:  aggravates ?  ?Past NSAIDS:  Mobic ?Past analgesics:  Excedrin ?Past abortive triptans:  sumatriptan 100mg  ?Past abortive ergotamine:  none ?Past muscle relaxants:  none ?Past anti-emetic:  Promethazine 25mg  ?Past antihypertensive medications:  none ?Past antidepressant medications:  none ?Past anticonvulsant medications:    topiramate 50mg  twice daily (dizziness, memory problems) ?Past anti-CGRP:  Nurtec (rescue- ineffective) ?Past vitamins/Herbal/Supplements:  none ?Past antihistamines/decongestants:  none ?Other past therapies:  none ?  ?Family history of headache:  no ? ?Past Medical History:  ?Diagnosis Date  ? Migraines   ? Thyroid disease   ? ? ?MEDICATIONS: ?Current Outpatient Medications on File Prior to Visit  ?Medication Sig Dispense Refill  ? ibuprofen (ADVIL) 600  MG tablet Take 1 tablet by mouth every 8 (eight) hours as needed.    ? levothyroxine (SYNTHROID) 112 MCG tablet Take 1 tablet (112 mcg total) by mouth daily at 6 (six) AM. 90 tablet 3  ?  nortriptyline (PAMELOR) 25 MG capsule Take 1 capsule (25 mg total) by mouth at bedtime. 90 capsule 3  ? PARAGARD INTRAUTERINE COPPER IU by Intrauterine route.    ? PFIZER-BIONTECH COVID-19 VACC 30 MCG/0.3ML injection     ? rizatriptan (MAXALT-MLT) 10 MG disintegrating tablet Take 1 tablet (10 mg total) by mouth as needed for migraine (May repeat in 2 hours if needed. Maximum 2 tablets in 24 hours.) 10 tablet 5  ? Vitamin D, Ergocalciferol, (DRISDOL) 1.25 MG (50000 UT) CAPS capsule Take 1 capsule (50,000 Units total) by mouth every 7 (seven) days. 4 capsule 3  ? ?No current facility-administered medications on file prior to visit.  ? ? ?ALLERGIES: ?No Known Allergies ? ?FAMILY HISTORY: ?Family History  ?Problem Relation Age of Onset  ? Hypertension Mother   ? Diabetes Father   ? ? ?  ?Objective:  ?Blood pressure 120/80, pulse (!) 112, resp. rate 18, height 5\' 2"  (1.575 m), weight 122 lb (55.3 kg), SpO2 98 %. ?General: No acute distress.  Patient appears well-groomed.   ? ? , DO ? ?CC: Shon Millet, PA-C ? ? ? ? ? ? ?

## 2022-04-07 ENCOUNTER — Other Ambulatory Visit (HOSPITAL_COMMUNITY): Payer: Self-pay

## 2022-04-07 ENCOUNTER — Ambulatory Visit (INDEPENDENT_AMBULATORY_CARE_PROVIDER_SITE_OTHER): Payer: 59 | Admitting: Neurology

## 2022-04-07 ENCOUNTER — Encounter: Payer: Self-pay | Admitting: Neurology

## 2022-04-07 VITALS — BP 120/80 | HR 112 | Resp 18 | Ht 62.0 in | Wt 122.0 lb

## 2022-04-07 DIAGNOSIS — G250 Essential tremor: Secondary | ICD-10-CM

## 2022-04-07 DIAGNOSIS — G43009 Migraine without aura, not intractable, without status migrainosus: Secondary | ICD-10-CM

## 2022-04-07 MED ORDER — NORTRIPTYLINE HCL 25 MG PO CAPS
25.0000 mg | ORAL_CAPSULE | Freq: Every day | ORAL | 3 refills | Status: DC
  Filled 2022-04-07 – 2022-05-27 (×2): qty 90, 90d supply, fill #0
  Filled 2022-08-24: qty 90, 90d supply, fill #1
  Filled 2022-11-26: qty 90, 90d supply, fill #2

## 2022-04-07 MED ORDER — CYCLOBENZAPRINE HCL 10 MG PO TABS
10.0000 mg | ORAL_TABLET | Freq: Three times a day (TID) | ORAL | 3 refills | Status: DC | PRN
  Filled 2022-04-07: qty 90, 30d supply, fill #0

## 2022-04-07 MED ORDER — RIZATRIPTAN BENZOATE 10 MG PO TBDP
10.0000 mg | ORAL_TABLET | ORAL | 5 refills | Status: DC | PRN
  Filled 2022-04-07: qty 10, 30d supply, fill #0
  Filled 2022-08-24: qty 10, 30d supply, fill #1
  Filled 2022-10-07: qty 10, 30d supply, fill #2

## 2022-04-07 NOTE — Patient Instructions (Signed)
Continue nortriptyline ?Rizatriptan as needed.  ?When you get neck pain, take cyclobenzaprine as needed ?Follow up 9 months. ?

## 2022-04-08 ENCOUNTER — Other Ambulatory Visit (HOSPITAL_COMMUNITY): Payer: Self-pay

## 2022-04-14 ENCOUNTER — Ambulatory Visit (HOSPITAL_BASED_OUTPATIENT_CLINIC_OR_DEPARTMENT_OTHER)
Admission: RE | Admit: 2022-04-14 | Discharge: 2022-04-14 | Disposition: A | Discharge status: Home / Self Care | Payer: 59 | Source: Ambulatory Visit | Attending: Medical | Admitting: Medical

## 2022-04-14 ENCOUNTER — Encounter (HOSPITAL_BASED_OUTPATIENT_CLINIC_OR_DEPARTMENT_OTHER): Payer: Self-pay

## 2022-04-14 DIAGNOSIS — Z1231 Encounter for screening mammogram for malignant neoplasm of breast: Secondary | ICD-10-CM | POA: Diagnosis not present

## 2022-05-27 ENCOUNTER — Other Ambulatory Visit (HOSPITAL_COMMUNITY): Payer: Self-pay

## 2022-07-13 ENCOUNTER — Other Ambulatory Visit (HOSPITAL_COMMUNITY): Payer: Self-pay

## 2022-07-24 ENCOUNTER — Telehealth: Payer: Self-pay | Admitting: Medical

## 2022-07-24 NOTE — Telephone Encounter (Signed)
Patient dropped form off  Placed in saguier bin up front  Patient would like to be called whenever its ready for pick up

## 2022-07-24 NOTE — Telephone Encounter (Signed)
Pt called and lvm to return call to schedule an appt for paperwork signing

## 2022-07-28 ENCOUNTER — Encounter: Payer: Self-pay | Admitting: Family Medicine

## 2022-07-28 ENCOUNTER — Ambulatory Visit: Payer: 59 | Admitting: Family Medicine

## 2022-07-28 VITALS — BP 98/78 | HR 74 | Temp 98.9°F | Resp 16 | Ht 62.0 in | Wt 119.8 lb

## 2022-07-28 DIAGNOSIS — Z111 Encounter for screening for respiratory tuberculosis: Secondary | ICD-10-CM

## 2022-07-28 NOTE — Progress Notes (Signed)
New Patient Office Visit  Subjective    Patient ID: Kristina Sherman, female    DOB: [birth year]  Age: 46 y.o. MRN: [id number]  CC:  Chief Complaint  Patient presents with   Forms    HPI Kristina Sherman presents to have form filled out for work for TB test  No complaints    Outpatient Encounter Medications as of 07/28/2022  Medication Sig   cyclobenzaprine (FLEXERIL) 10 MG tablet Take 1 tablet (10 mg total) by mouth 3 (three) times daily as needed for muscle spasms.   levothyroxine (SYNTHROID) 112 MCG tablet Take 1 tablet (112 mcg total) by mouth daily at 6 (six) AM.   nortriptyline (PAMELOR) 25 MG capsule Take 1 capsule (25 mg total) by mouth at bedtime.   PARAGARD INTRAUTERINE COPPER IU by Intrauterine route.   rizatriptan (MAXALT-MLT) 10 MG disintegrating tablet Take 1 tablet (10 mg total) by mouth as needed for migraine (May repeat in 2 hours if needed. Maximum 2 tablets in 24 hours.)   Vitamin D, Ergocalciferol, (DRISDOL) 1.25 MG (50000 UT) CAPS capsule Take 1 capsule (50,000 Units total) by mouth every 7 (seven) days.   ibuprofen (ADVIL) 600 MG tablet Take 1 tablet by mouth every 8 (eight) hours as needed. (Patient not taking: Reported on 04/07/2022)   [DISCONTINUED] PFIZER-BIONTECH COVID-19 VACC 30 MCG/0.3ML injection  (Patient not taking: Reported on 07/28/2022)   No facility-administered encounter medications on file as of 07/28/2022.    Past Medical History:  Diagnosis Date   Migraines    Thyroid disease     Past Surgical History:  Procedure Laterality Date   CESAREAN SECTION      Family History  Problem Relation Age of Onset   Hypertension Mother    Diabetes Father     Social History   Socioeconomic History   Marital status: Married    Spouse name: Not on file   Number of children: 2   Years of education: Not on file   Highest education level: Not on file  Occupational History   Occupation: uemployed  Tobacco Use   Smoking status: Never   Smokeless  tobacco: Never  Vaping Use   Vaping Use: Never used  Substance and Sexual Activity   Alcohol use: Never   Drug use: Never   Sexual activity: Yes    Birth control/protection: I.U.D.  Other Topics Concern   Not on file  Social History Narrative   Patient is right-handed. Patient lives with her husband and 2 children in 2 story home. She drinks 3 cups of tea a day. She exercise daily, yoga and walking.   Social Determinants of Health   Financial Resource Strain: Not on file  Food Insecurity: Not on file  Transportation Needs: Not on file  Physical Activity: Not on file  Stress: Not on file  Social Connections: Not on file  Intimate Partner Violence: Not on file    Review of Systems  Constitutional:  Negative for fever and malaise/fatigue.  HENT:  Negative for congestion.   Eyes:  Negative for blurred vision.  Respiratory:  Negative for cough and shortness of breath.   Cardiovascular:  Negative for chest pain, palpitations and leg swelling.  Gastrointestinal:  Negative for vomiting.  Musculoskeletal:  Negative for back pain.  Skin:  Negative for rash.  Neurological:  Negative for loss of consciousness and headaches.        Objective    BP 98/78 (BP Location: Left Arm, Patient Position: Sitting)  Pulse 74   Temp 98.9 F (37.2 C) (Oral)   Resp 16   Ht 5\' 2"  (1.575 m)   Wt 119 lb 12.8 oz (54.3 kg)   SpO2 99%   BMI 21.91 kg/m   Physical Exam Vitals and nursing note reviewed.  Constitutional:      Appearance: She is well-developed.  HENT:     Head: Normocephalic and atraumatic.  Eyes:     Conjunctiva/sclera: Conjunctivae normal.  Neck:     Thyroid: No thyromegaly.     Vascular: No carotid bruit or JVD.  Cardiovascular:     Rate and Rhythm: Normal rate and regular rhythm.     Heart sounds: Normal heart sounds. No murmur heard. Pulmonary:     Effort: Pulmonary effort is normal. No respiratory distress.     Breath sounds: Normal breath sounds. No wheezing or  rales.  Chest:     Chest wall: No tenderness.  Musculoskeletal:     Cervical back: Normal range of motion and neck supple.  Neurological:     Mental Status: She is alert and oriented to person, place, and time.         Assessment & Plan:   Problem List Items Addressed This Visit   None Visit Diagnoses     Screening-pulmonary TB    -  Primary   Relevant Orders   QuantiFERON-TB Gold Plus      Form filled out No follow-ups on file.   , DO

## 2022-07-31 LAB — QUANTIFERON-TB GOLD PLUS
Mitogen-NIL: 8.74 IU/mL
NIL: 0.02 IU/mL
QuantiFERON-TB Gold Plus: NEGATIVE
TB1-NIL: 0.01 IU/mL
TB2-NIL: 0 IU/mL

## 2022-08-03 ENCOUNTER — Encounter: Payer: Self-pay | Admitting: Family Medicine

## 2022-08-05 DIAGNOSIS — H52223 Regular astigmatism, bilateral: Secondary | ICD-10-CM | POA: Diagnosis not present

## 2022-08-05 DIAGNOSIS — H5213 Myopia, bilateral: Secondary | ICD-10-CM | POA: Diagnosis not present

## 2022-08-25 ENCOUNTER — Other Ambulatory Visit (HOSPITAL_COMMUNITY): Payer: Self-pay

## 2022-10-01 ENCOUNTER — Other Ambulatory Visit (HOSPITAL_COMMUNITY): Payer: Self-pay

## 2022-10-01 MED ORDER — BENZONATATE 200 MG PO CAPS
200.0000 mg | ORAL_CAPSULE | Freq: Three times a day (TID) | ORAL | 0 refills | Status: DC | PRN
  Filled 2022-10-01: qty 21, 7d supply, fill #0

## 2022-10-08 ENCOUNTER — Other Ambulatory Visit (HOSPITAL_COMMUNITY): Payer: Self-pay

## 2022-10-20 ENCOUNTER — Encounter: Payer: Self-pay | Admitting: General Practice

## 2022-11-11 ENCOUNTER — Ambulatory Visit
Admission: RE | Admit: 2022-11-11 | Discharge: 2022-11-11 | Disposition: A | Discharge status: Home / Self Care | Payer: 59 | Source: Ambulatory Visit | Attending: Emergency Medicine | Admitting: Emergency Medicine

## 2022-11-11 ENCOUNTER — Other Ambulatory Visit (HOSPITAL_COMMUNITY): Payer: Self-pay

## 2022-11-11 ENCOUNTER — Other Ambulatory Visit: Payer: Self-pay

## 2022-11-11 VITALS — BP 116/81 | HR 90 | Temp 99.5°F | Resp 16 | Ht 62.0 in | Wt 120.0 lb

## 2022-11-11 DIAGNOSIS — J069 Acute upper respiratory infection, unspecified: Secondary | ICD-10-CM

## 2022-11-11 DIAGNOSIS — R350 Frequency of micturition: Secondary | ICD-10-CM

## 2022-11-11 LAB — POCT URINALYSIS DIP (MANUAL ENTRY)
Bilirubin, UA: NEGATIVE
Glucose, UA: NEGATIVE mg/dL
Ketones, POC UA: NEGATIVE mg/dL
Leukocytes, UA: NEGATIVE
Nitrite, UA: NEGATIVE
Protein Ur, POC: NEGATIVE mg/dL
Spec Grav, UA: 1.02 (ref 1.010–1.025)
Urobilinogen, UA: 0.2 E.U./dL
pH, UA: 7.5 (ref 5.0–8.0)

## 2022-11-11 LAB — POCT INFLUENZA A/B
Influenza A, POC: NEGATIVE
Influenza B, POC: NEGATIVE

## 2022-11-11 LAB — POC SARS CORONAVIRUS 2 AG -  ED: SARS Coronavirus 2 Ag: NEGATIVE

## 2022-11-11 MED ORDER — PSEUDOEPH-BROMPHEN-DM 30-2-10 MG/5ML PO SYRP
5.0000 mL | ORAL_SOLUTION | Freq: Four times a day (QID) | ORAL | 0 refills | Status: DC | PRN
  Filled 2022-11-11: qty 120, 6d supply, fill #0

## 2022-11-11 NOTE — ED Triage Notes (Signed)
Cough, chills, fever, congestion, urine feels hot x 4 days

## 2022-11-11 NOTE — Discharge Instructions (Addendum)
Flu and COVID tests negative. Consistent with viral infection. Take medicine as prescribed as needed for symptoms, rest, and keep hydrated.  No sign of UTI on urine in clinic.  Follow-up with PCP if no improvement in a week. Go to the ER if develop difficulty breathing.

## 2022-11-11 NOTE — ED Provider Notes (Signed)
[facility]    CSN: [id number] Arrival date & time: 11/11/22  1008      History   Chief Complaint Chief Complaint  Patient presents with   Urinary Retention    Was cold and cough seems like fever . - Entered by patient   Cough    HPI Kristina Sherman is a 46 y.o. female.   Patient presents with concerns of feeling unwell the past few days. She reports headache, body aches, chills, feeling feverish, nasal congestion, sore throat, and cough. She denies difficulty breathing. She has tried taking Theraflu and Tylenol with minimal improvement. 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 She states she has had some urinary frequency and feels like her urine is hot like she has a fever but denies pain with urination.   The history is provided by the patient.  Cough Associated symptoms: chills, headaches, myalgias, rhinorrhea and sore throat   Associated symptoms: no ear pain, no fever, no rash and no shortness of breath     Past Medical History:  Diagnosis Date   Migraines    Thyroid disease     Patient Active Problem List   Diagnosis Date Noted   Vitamin D deficiency 01/18/2019   Migraine without status migrainosus, not intractable 06/23/2018   Acquired hypothyroidism 03/02/2018   Perennial allergic rhinitis with seasonal variation 03/02/2018   Migraine without aura and without status migrainosus, not intractable 05/11/2016    Past Surgical History:  Procedure Laterality Date   CESAREAN SECTION      OB History     Gravida  2   Para  2   Term  2   Preterm      AB      Living  2      SAB      IAB      Ectopic      Multiple      Live Births  2            Home Medications    Prior to Admission medications   Medication Sig Start Date End Date Taking? Authorizing Provider  brompheniramine-pseudoephedrine-DM 30-2-10 MG/5ML syrup Take 5 mLs by mouth 4 (four) times daily as needed. 11/11/22  Yes Levoy Geisen L, PA   ibuprofen (ADVIL) 600 MG tablet Take 1 tablet by mouth every 8 (eight) hours as needed. Patient not taking: Reported on 04/07/2022 02/22/18   [provider]  levothyroxine (SYNTHROID) 112 MCG tablet Take 1 tablet (112 mcg total) by mouth daily at 6 (six) AM. 04/06/22   Saguier, Percell Miller, PA-C  nortriptyline (PAMELOR) 25 MG capsule Take 1 capsule (25 mg total) by mouth at bedtime. 04/07/22   Tomi Likens, Adam R, DO  PARAGARD INTRAUTERINE COPPER IU by Intrauterine route.    [provider]  Vitamin D, Ergocalciferol, (DRISDOL) 1.25 MG (50000 UT) CAPS capsule Take 1 capsule (50,000 Units total) by mouth every 7 (seven) days. 01/18/19   CiriglianoGarvin Fila, DO    Family History Family History  Problem Relation Age of Onset   Hypertension Mother    Diabetes Father     Social History Social History   Tobacco Use   Smoking status: Never   Smokeless tobacco: Never  Vaping Use   Vaping Use: Never used  Substance Use Topics   Alcohol use: Never   Drug use: Never     Allergies   Patient has no known allergies.   Review of Systems Review of  Systems  Constitutional:  Positive for chills and fatigue. Negative for fever.  HENT:  Positive for congestion, rhinorrhea and sore throat. Negative for ear pain.   Respiratory:  Positive for cough. Negative for shortness of breath.   Gastrointestinal:  Negative for abdominal pain, nausea and vomiting.  Genitourinary:  Positive for frequency. Negative for dysuria.  Musculoskeletal:  Positive for myalgias.  Skin:  Negative for rash.  Neurological:  Positive for headaches. Negative for dizziness.     Physical Exam Triage Vital Signs ED Triage Vitals  Enc Vitals Group     BP 11/11/22 1019 116/81     Pulse Rate 11/11/22 1019 90     Resp 11/11/22 1019 16     Temp 11/11/22 1019 99.5 F (37.5 C)     Temp Source 11/11/22 1019 Oral     SpO2 11/11/22 1019 98 %     Weight 11/11/22 1020 120 lb (54.4 kg)     Height 11/11/22 1020 5\' 2"  (1.575  m)     Head Circumference --      Peak Flow --      Pain Score 11/11/22 1020 6     Pain Loc --      Pain Edu? --      Excl. in GC? --    No data found.  Updated Vital Signs BP 116/81 (BP Location: Left Arm)   Pulse 90   Temp 99.5 F (37.5 C) (Oral)   Resp 16   Ht 5\' 2"  (1.575 m)   Wt 120 lb (54.4 kg)   SpO2 98%   BMI 21.95 kg/m   Visual Acuity Right Eye Distance:   Left Eye Distance:   Bilateral Distance:    Right Eye Near:   Left Eye Near:    Bilateral Near:     Physical Exam Vitals and nursing note reviewed.  Constitutional:      General: She is not in acute distress. HENT:     Head: Normocephalic.     Nose: Congestion and rhinorrhea present.     Mouth/Throat:     Mouth: Mucous membranes are moist.     Pharynx: Oropharynx is clear. No oropharyngeal exudate or posterior oropharyngeal erythema.  Eyes:     Conjunctiva/sclera: Conjunctivae normal.     Pupils: Pupils are equal, round, and reactive to light.  Cardiovascular:     Rate and Rhythm: Normal rate and regular rhythm.     Heart sounds: Normal heart sounds.  Pulmonary:     Effort: Pulmonary effort is normal. No respiratory distress.     Breath sounds: Normal breath sounds. No stridor. No wheezing, rhonchi or rales.  Lymphadenopathy:     Cervical: No cervical adenopathy.  Skin:    Findings: No rash.  Neurological:     Mental Status: She is alert.  Psychiatric:        Mood and Affect: Mood normal.      UC Treatments / Results  Labs (all labs ordered are listed, but only abnormal results are displayed) Labs Reviewed  POCT URINALYSIS DIP (MANUAL ENTRY) - Abnormal; Notable for the following components:      Result Value   Blood, UA trace-intact (*)    All other components within normal limits  POCT INFLUENZA A/B  POC SARS CORONAVIRUS 2 AG -  ED    EKG   Radiology No results found.  Procedures Procedures (including critical care time)  Medications Ordered in UC Medications - No data to  display  Initial Impression /  Assessment and Plan / UC Course  I have reviewed the triage vital signs and the nursing notes.  Pertinent labs & imaging results that were available during my care of the patient were reviewed by me and considered in my medical decision making (see chart for details).     Only urinary sx is mild frequency - U/A without signs of infection. URI sx w negative flu and COVID. Sx tx and reassurance.   E/M: 1 acute uncomplicated illness, 3 data, moderate risk due to prescription management  Final Clinical Impressions(s) / UC Diagnoses   Final diagnoses:  Urinary frequency  Viral URI with cough     Discharge Instructions      Flu and COVID tests negative. Consistent with viral infection. Take medicine as prescribed as needed for symptoms, rest, and keep hydrated.  No sign of UTI on urine in clinic.  Follow-up with PCP if no improvement in a week. Go to the ER if develop difficulty breathing.      ED Prescriptions     Medication Sig Dispense Auth. Provider   brompheniramine-pseudoephedrine-DM 30-2-10 MG/5ML syrup Take 5 mLs by mouth 4 (four) times daily as needed. 120 mL Abner Greenspan, Machel Violante L, PA      PDMP not reviewed this encounter.   Delsa Sale, [place] 11/11/22 1105

## 2022-11-26 ENCOUNTER — Other Ambulatory Visit: Payer: Self-pay | Admitting: Neurology

## 2022-11-26 ENCOUNTER — Other Ambulatory Visit (HOSPITAL_COMMUNITY): Payer: Self-pay

## 2022-11-26 NOTE — Telephone Encounter (Signed)
LMOVm for patient to call the office back.  

## 2022-12-14 ENCOUNTER — Other Ambulatory Visit (HOSPITAL_COMMUNITY): Payer: Self-pay

## 2022-12-29 ENCOUNTER — Encounter: Payer: Self-pay | Admitting: Neurology

## 2022-12-30 ENCOUNTER — Encounter: Payer: Self-pay | Admitting: General Practice

## 2022-12-30 ENCOUNTER — Other Ambulatory Visit (HOSPITAL_COMMUNITY): Payer: Self-pay

## 2022-12-30 ENCOUNTER — Other Ambulatory Visit (HOSPITAL_COMMUNITY)
Admission: RE | Admit: 2022-12-30 | Discharge: 2022-12-30 | Disposition: A | Discharge status: Home / Self Care | Payer: 59 | Source: Ambulatory Visit | Attending: Obstetrics & Gynecology | Admitting: Obstetrics & Gynecology

## 2022-12-30 ENCOUNTER — Other Ambulatory Visit: Payer: Self-pay | Admitting: Neurology

## 2022-12-30 ENCOUNTER — Encounter: Payer: Self-pay | Admitting: Obstetrics & Gynecology

## 2022-12-30 ENCOUNTER — Ambulatory Visit (INDEPENDENT_AMBULATORY_CARE_PROVIDER_SITE_OTHER): Payer: 59 | Admitting: Obstetrics & Gynecology

## 2022-12-30 ENCOUNTER — Telehealth: Payer: Self-pay | Admitting: General Practice

## 2022-12-30 VITALS — BP 109/74 | HR 70 | Ht 62.0 in | Wt 121.0 lb

## 2022-12-30 DIAGNOSIS — Z1231 Encounter for screening mammogram for malignant neoplasm of breast: Secondary | ICD-10-CM

## 2022-12-30 DIAGNOSIS — Z3009 Encounter for other general counseling and advice on contraception: Secondary | ICD-10-CM

## 2022-12-30 DIAGNOSIS — Z30433 Encounter for removal and reinsertion of intrauterine contraceptive device: Secondary | ICD-10-CM | POA: Diagnosis not present

## 2022-12-30 DIAGNOSIS — Z01419 Encounter for gynecological examination (general) (routine) without abnormal findings: Secondary | ICD-10-CM | POA: Insufficient documentation

## 2022-12-30 DIAGNOSIS — Z30431 Encounter for routine checking of intrauterine contraceptive device: Secondary | ICD-10-CM

## 2022-12-30 MED ORDER — RIZATRIPTAN BENZOATE 10 MG PO TBDP
10.0000 mg | ORAL_TABLET | ORAL | 5 refills | Status: DC | PRN
  Filled 2022-12-30: qty 10, 30d supply, fill #0

## 2022-12-30 MED ORDER — PARAGARD INTRAUTERINE COPPER IU IUD
1.0000 | INTRAUTERINE_SYSTEM | Freq: Once | INTRAUTERINE | Status: AC
Start: 2022-12-30 — End: 2022-12-30
  Administered 2022-12-30: 1 via INTRAUTERINE

## 2022-12-30 NOTE — Progress Notes (Signed)
Subjective:     Kristina Sherman is a 47 y.o. female here for a routine exam.  Current complaints: no concerning issues. Pt has had Paragard IUD for 10 years. Has monthly cycles with Paragard. Would like to her it removed and replaced today. Her menses are not too heavy.    Gynecologic History No LMP recorded. (Menstrual status: IUD). Contraception: IUD Last Pap: 05/22/2020 Results: WNL Last mammogram: 04/14/2022. Results were: normal  Obstetric History OB History  Gravida Para Term Preterm AB Living  2 2 2     2   SAB IAB Ectopic Multiple Live Births          2    # Outcome Date GA Lbr Len/2nd Weight Sex Delivery Anes PTL Lv  2 Term 2013 [redacted]w[redacted]d   F CS-LTranv Spinal N LIV  1 Term 2007 [redacted]w[redacted]d   M CS-LTranv EPI N LIV    The following portions of the patient's history were reviewed and updated as appropriate: allergies, current medications, past family history, past medical history, past social history, past surgical history, and problem list.  Review of Systems Pertinent items are noted in HPI.    Objective:  BP 109/74   Pulse 70   Ht 5\' 2"  (1.575 m)   Wt 121 lb (54.9 kg)   BMI 22.13 kg/m   General Appearance:    Alert, cooperative, no distress, appears stated age  Head:    Normocephalic, without obvious abnormality, atraumatic  Eyes:    conjunctiva/corneas clear, EOM's intact, both eyes  Ears:    Normal external ear canals, both ears  Nose:   Nares normal, septum midline, mucosa normal, no drainage    or sinus tenderness  Throat:   Lips, mucosa, and tongue normal; teeth and gums normal  Neck:   Supple, symmetrical, trachea midline, no adenopathy;    thyroid:  no enlargement/tenderness/nodules  Back:     Symmetric, no curvature, ROM normal, no CVA tenderness  Lungs:     respirations unlabored  Chest Wall:    No tenderness or deformity   Heart:    Regular rate and rhythm  Breast Exam:    No tenderness, masses, or nipple abnormality  Abdomen:     Soft, non-tender, bowel sounds  active all four quadrants,    no masses, no organomegaly  Genitalia:    Normal female without lesion, discharge or tenderness   IUD strings noted in upper vagina.   Extremities:   Extremities normal, atraumatic, no cyanosis or edema  Pulses:   2+ and symmetric all extremities  Skin:   Skin color, texture, turgor normal, no rashes or lesions    GYN procedure Kristina Sherman is a 47 y.o. [id number] here for Paragard IUD removal and reinsertion.  IUD Removal and Reinsertion  Patient identified, informed consent performed. Discussed risks of irregular bleeding, cramping, infection, malpositioning or misplacement of the IUD outside the uterus which may require further procedures. Time out was performed. Speculum placed in the vagina. Cervix visualized. Cleaned with Betadine x 2. Grasped anteriorly with a single tooth tenaculum. The strings of the IUD were grasped and pulled using ring forceps. The IUD was successfully removed in its entirety. Uterus sounded to 7 cm. Paragard IUD placed per manufacturer's recommendations. Strings trimmed to 3 cm. Tenaculum was removed, good hemostasis noted. Patient tolerated procedure well.    Assessment:    Healthy female exam.    Plan:   [name] was seen today for annual exam.  Diagnoses and all orders  for this visit:  Well female exam with routine gynecological exam -     Cytology - PAP( [place])  Encounter for counseling regarding contraception  Family planning, IUD (intrauterine device) check/reinsertion/removal  Breast cancer screening by mammogram -     MM 3D SCREEN BREAST BILATERAL; Future   Patient was given post-procedure instructions.  Patient was also asked to cf/u if she had an y issues and in 4 weeks for IUD check.  Mcihael Hinderman L. Harraway-Smith, M.D., Cherlynn June

## 2022-12-30 NOTE — Telephone Encounter (Signed)
Patient left without checking out.  Left message on VM for patient to contact our office to schedule 4 week string check with Dr. Ihor Dow.

## 2022-12-30 NOTE — Patient Instructions (Signed)

## 2023-01-06 LAB — CYTOLOGY - PAP
Adequacy: ABSENT
Comment: NEGATIVE
Diagnosis: NEGATIVE
Diagnosis: REACTIVE
High risk HPV: NEGATIVE

## 2023-01-06 NOTE — Progress Notes (Signed)
NEUROLOGY FOLLOW UP OFFICE NOTE  [name] [id number]  Assessment/Plan:   I  Migraine without aura, without status migrainosus, not intractable II  Probable essential tremor - infrequent and not requiring propranolol or other medication at this time.   Nortriptyline 25mg  at bedtime  Rizatriptan 10mg  for rescue therapy (she wants sumatriptan on-hand as well - advised to limit use of triptans and all other analgesics to no more than 2 days out of week) Follow up 6 months.     Subjective:  Kristina Sherman is a 47 year old woman who follows up for migraine.   UPDATE: Intensity:  moderate-severe Duration:  brief with rizatritan. Frequency:  1 day in last month  Tremors are not bothersome.  She notices it if she is using her hands fast.     Rescue protocol:  rizatriptan Current NSAIDS:  none Current analgesics:  none Current triptans: rizatriptan 10mg  Current ergotamine:  none Current anti-emetic:  none Current muscle relaxants:  cyclobenzaprine 10mg  PRN (neck pain) Current anti-anxiolytic:  none Current sleep aide:  none Current Antihypertensive medications:  none Current Antidepressant medications:  nortriptyline 25mg  at bedtime Current Anticonvulsant medications: none Current anti-CGRP:  none Current Vitamins/Herbal/Supplements:  D Current Antihistamines/Decongestants:  none Other therapy:  none Hormone/birth control:  none Other medications:  Synthroid   Caffeine:  2 to 4 cups of tea daily Diet:  2-3 16 oz bottles of water daily Exercise:  yes Depression:  no; Anxiety:  no Other pain:  no Sleep hygiene:  varies   HISTORY:  Onset: Since childhood.  More frequent since around 2015-2016 Location:  left sided from behind left eye to back of head, sometimes right side Quality:  Stabbing needle Initial intensity:  7/10.  She denies new headache, thunderclap headache Aura:  no Premonitory Phase:  no Postdrome:  no Associated symptoms:  When severe there is  associated nausea and vomiting.  She denies associated photophobia, phonophobia, visual disturbance, unilateral numbness or weakness. Initial duration:  2 to 5 days. Initial Frequency:  On average it occurs every 6 weeks.  When it occurs, she often will wake up in the middle of the night with it. Initial Frequency of abortive medication: 2 to 5 days every 6 weeks Triggers: Sleep deprivation, jumping, loud talking, cold air Relieving factors: None Activity:  aggravates  In 2022, she has noticed a tremor in her right hand.  Noticeable with action or holding objects.  Does not involve the left hand.  No known family history of tremor.  MRI of brain with and without contrast on 09/26/2021 was normal.    Past NSAIDS:  Mobic Past analgesics:  Excedrin Past abortive triptans:  sumatriptan 100mg  Past abortive ergotamine:  none Past muscle relaxants:  none Past anti-emetic:  Promethazine 25mg  Past antihypertensive medications:  none Past antidepressant medications:  none Past anticonvulsant medications:    topiramate 50mg  twice daily (dizziness, memory problems) Past anti-CGRP:  Nurtec (rescue- ineffective) Past vitamins/Herbal/Supplements:  none Past antihistamines/decongestants:  none Other past therapies:  none   Family history of headache:  no  PAST MEDICAL HISTORY: Past Medical History:  Diagnosis Date   Migraines    Thyroid disease     MEDICATIONS: Current Outpatient Medications on File Prior to Visit  Medication Sig Dispense Refill   brompheniramine-pseudoephedrine-DM 30-2-10 MG/5ML syrup Take 5 mLs by mouth 4 (four) times daily as needed. (Patient not taking: Reported on 12/30/2022) 120 mL 0   ibuprofen (ADVIL) 600 MG tablet Take 1 tablet by mouth every  8 (eight) hours as needed. (Patient not taking: Reported on 04/07/2022)     levothyroxine (SYNTHROID) 112 MCG tablet Take 1 tablet (112 mcg total) by mouth daily at 6 (six) AM. 90 tablet 3   nortriptyline (PAMELOR) 25 MG capsule  Take 1 capsule (25 mg total) by mouth at bedtime. 90 capsule 3   PARAGARD INTRAUTERINE COPPER IU by Intrauterine route.     rizatriptan (MAXALT-MLT) 10 MG disintegrating tablet Take 1 tablet (10 mg total) by mouth as needed for migraine (May repeat in 2 hours if needed. Maximum 2 tablets in 24 hours.) 10 tablet 5   Vitamin D, Ergocalciferol, (DRISDOL) 1.25 MG (50000 UT) CAPS capsule Take 1 capsule (50,000 Units total) by mouth every 7 (seven) days. 4 capsule 3   No current facility-administered medications on file prior to visit.    ALLERGIES: No Known Allergies  FAMILY HISTORY: Family History  Problem Relation Age of Onset   Hypertension Mother    Diabetes Father       Objective:  Blood pressure 115/74, pulse 78, resp. rate 18, height 5\' 2"  (1.575 m), weight 115 lb (52.2 kg), SpO2 100 %. General: No acute distress.  Patient appears well-groomed.   Head:  Normocephalic/atraumatic Eyes:  Fundi examined but not visualized Neck: supple, no paraspinal tenderness, full range of motion Heart:  Regular rate and rhythm Lungs:  Clear to auscultation bilaterally Back: No paraspinal tenderness Neurological Exam: alert and oriented to person, place, and time.  Speech fluent and not dysarthric, language intact.  CN II-XII intact. Bulk and tone normal, muscle strength 5/5 throughout.  Maybe very slight fine tremor in hands when outstretched.  Sensation to light touch intact.  Deep tendon reflexes 2+ throughout.  Finger to nose testing intact.  Gait normal, Romberg negative.   Metta Clines, DO  CC: Mackie Pai, PA-C

## 2023-01-08 ENCOUNTER — Encounter: Payer: Self-pay | Admitting: Neurology

## 2023-01-08 ENCOUNTER — Ambulatory Visit (INDEPENDENT_AMBULATORY_CARE_PROVIDER_SITE_OTHER): Payer: 59 | Admitting: Neurology

## 2023-01-08 ENCOUNTER — Other Ambulatory Visit (HOSPITAL_COMMUNITY): Payer: Self-pay

## 2023-01-08 VITALS — BP 115/74 | HR 78 | Resp 18 | Ht 62.0 in | Wt 115.0 lb

## 2023-01-08 DIAGNOSIS — G250 Essential tremor: Secondary | ICD-10-CM | POA: Diagnosis not present

## 2023-01-08 DIAGNOSIS — G43009 Migraine without aura, not intractable, without status migrainosus: Secondary | ICD-10-CM | POA: Diagnosis not present

## 2023-01-08 MED ORDER — NORTRIPTYLINE HCL 25 MG PO CAPS
25.0000 mg | ORAL_CAPSULE | Freq: Every day | ORAL | 3 refills | Status: DC
Start: 2023-01-08 — End: 2024-01-14
  Filled 2023-01-08 – 2023-03-08 (×2): qty 90, 90d supply, fill #0
  Filled 2023-06-30: qty 90, 90d supply, fill #1
  Filled 2023-09-24: qty 90, 90d supply, fill #2
  Filled 2024-01-04: qty 90, 90d supply, fill #3

## 2023-01-08 MED ORDER — RIZATRIPTAN BENZOATE 10 MG PO TBDP
10.0000 mg | ORAL_TABLET | ORAL | 5 refills | Status: DC | PRN
  Filled 2023-01-08 – 2023-03-08 (×2): qty 10, 30d supply, fill #0
  Filled 2023-05-03 – 2023-09-24 (×2): qty 10, 30d supply, fill #1

## 2023-01-08 MED ORDER — SUMATRIPTAN SUCCINATE 100 MG PO TABS
ORAL_TABLET | ORAL | 5 refills | Status: DC
  Filled 2023-01-08: qty 9, 30d supply, fill #0
  Filled 2023-03-08: qty 9, 30d supply, fill #1
  Filled 2023-09-24: qty 9, 30d supply, fill #2
  Filled 2023-10-18: qty 9, 30d supply, fill #3
  Filled 2023-11-30: qty 9, 30d supply, fill #4

## 2023-01-08 NOTE — Patient Instructions (Addendum)
Nortriptyline at bedtime Sumatriptan OR rizatriptan as needed.  Limit use of pain relievers to no more than 2 days out of week to prevent risk of rebound or medication-overuse headache. Follow up 6 months.

## 2023-01-27 ENCOUNTER — Ambulatory Visit: Payer: 59 | Admitting: Obstetrics & Gynecology

## 2023-02-17 ENCOUNTER — Ambulatory Visit: Payer: 59 | Admitting: Obstetrics & Gynecology

## 2023-03-08 ENCOUNTER — Other Ambulatory Visit (HOSPITAL_COMMUNITY): Payer: Self-pay

## 2023-03-15 ENCOUNTER — Encounter: Payer: 59 | Admitting: Medical

## 2023-03-23 ENCOUNTER — Ambulatory Visit (INDEPENDENT_AMBULATORY_CARE_PROVIDER_SITE_OTHER): Payer: 59 | Admitting: Medical

## 2023-03-23 VITALS — BP 90/62 | HR 85 | Temp 97.7°F | Resp 18 | Ht 62.0 in | Wt 121.0 lb

## 2023-03-23 DIAGNOSIS — E559 Vitamin D deficiency, unspecified: Secondary | ICD-10-CM

## 2023-03-23 DIAGNOSIS — E039 Hypothyroidism, unspecified: Secondary | ICD-10-CM

## 2023-03-23 DIAGNOSIS — Z0000 Encounter for general adult medical examination without abnormal findings: Secondary | ICD-10-CM | POA: Diagnosis not present

## 2023-03-23 LAB — COMPREHENSIVE METABOLIC PANEL
ALT: 6 U/L (ref 0–35)
AST: 21 U/L (ref 0–37)
Albumin: 4.2 g/dL (ref 3.5–5.2)
Alkaline Phosphatase: 61 U/L (ref 39–117)
BUN: 12 mg/dL (ref 6–23)
CO2: 30 mEq/L (ref 19–32)
Calcium: 9.4 mg/dL (ref 8.4–10.5)
Chloride: 104 mEq/L (ref 96–112)
Creatinine, Ser: 0.84 mg/dL (ref 0.40–1.20)
GFR: 84.37 mL/min (ref >60.00)
Glucose, Bld: 80 mg/dL (ref 70–99)
Potassium: 4.6 mEq/L (ref 3.5–5.1)
Sodium: 138 mEq/L (ref 135–145)
Total Bilirubin: 0.6 mg/dL (ref 0.2–1.2)
Total Protein: 7.2 g/dL (ref 6.0–8.3)

## 2023-03-23 LAB — LIPID PANEL
Cholesterol: 165 mg/dL (ref 0–200)
HDL: 61.6 mg/dL (ref >39.00)
LDL Cholesterol: 96 mg/dL (ref 0–99)
NonHDL: 103.5
Total CHOL/HDL Ratio: 3
Triglycerides: 38 mg/dL (ref 0.0–149.0)
VLDL: 7.6 mg/dL (ref 0.0–40.0)

## 2023-03-23 LAB — CBC WITH DIFFERENTIAL/PLATELET
Basophils Absolute: 0.1 10*3/uL (ref 0.0–0.1)
Basophils Relative: 1.1 % (ref 0.0–3.0)
Eosinophils Absolute: 0.1 10*3/uL (ref 0.0–0.7)
Eosinophils Relative: 2.9 % (ref 0.0–5.0)
HCT: 34.1 % — ABNORMAL LOW (ref 36.0–46.0)
Hemoglobin: 11.5 g/dL — ABNORMAL LOW (ref 12.0–15.0)
Lymphocytes Relative: 27.6 % (ref 12.0–46.0)
Lymphs Abs: 1.2 10*3/uL (ref 0.7–4.0)
MCHC: 33.8 g/dL (ref 30.0–36.0)
MCV: 81.4 fl (ref 78.0–100.0)
Monocytes Absolute: 0.5 10*3/uL (ref 0.1–1.0)
Monocytes Relative: 10.7 % (ref 3.0–12.0)
Neutro Abs: 2.6 10*3/uL (ref 1.4–7.7)
Neutrophils Relative %: 57.7 % (ref 43.0–77.0)
Platelets: 225 10*3/uL (ref 150.0–400.0)
RBC: 4.19 Mil/uL (ref 3.87–5.11)
RDW: 14.7 % (ref 11.5–15.5)
WBC: 4.5 10*3/uL (ref 4.0–10.5)

## 2023-03-23 LAB — T4, FREE: Free T4: 0.88 ng/dL (ref 0.60–1.60)

## 2023-03-23 LAB — VITAMIN D 25 HYDROXY (VIT D DEFICIENCY, FRACTURES): VITD: 26.54 ng/mL — ABNORMAL LOW (ref 30.00–100.00)

## 2023-03-23 LAB — TSH: TSH: 1.79 u[IU]/mL (ref 0.35–5.50)

## 2023-03-23 MED ORDER — LEVOTHYROXINE SODIUM 112 MCG PO TABS
112.0000 ug | ORAL_TABLET | Freq: Every day | ORAL | 3 refills | Status: DC
  Filled 2023-03-23: qty 90, 90d supply, fill #0
  Filled 2023-07-19: qty 90, 90d supply, fill #1
  Filled 2023-10-18: qty 90, 90d supply, fill #2
  Filled 2024-01-19: qty 90, 90d supply, fill #3

## 2023-03-23 MED ORDER — VITAMIN D (ERGOCALCIFEROL) 1.25 MG (50000 UNIT) PO CAPS
50000.0000 [IU] | ORAL_CAPSULE | ORAL | 0 refills | Status: AC
  Filled 2023-03-23: qty 8, 56d supply, fill #0

## 2023-03-23 NOTE — Progress Notes (Signed)
Subjective:    Patient ID: Kristina Sherman, female    DOB: [birth year], 47 y.o.   MRN: [id number]  HPI Pt in for wellness exam.  Pt is fasting. Decided to go ahead and do wellness exam.    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 Pt walks 5 days a week. Pt diet is healthy. Pt drinks tea 2-3 times. Non smoker. No alcohol.   Pt is up to date on papsmear. Pt will get mammogram in May.  Pt had low thyroid. Will recheck tsh and t4 today.  Low vit d in past. She taking vit d gummy supplament.   Review of Systems  Constitutional:  Negative for chills, fatigue and fever.  HENT:  Negative for congestion, ear discharge and ear pain.   Respiratory:  Negative for cough, chest tightness, shortness of breath and wheezing.   Cardiovascular:  Negative for chest pain and palpitations.  Gastrointestinal:  Negative for abdominal pain, constipation, diarrhea and nausea.  Genitourinary:  Negative for dyspareunia, dysuria, flank pain, frequency and urgency.  Musculoskeletal:  Negative for back pain and gait problem.  Skin:  Negative for rash.  Neurological:  Negative for dizziness, speech difficulty, weakness, numbness and headaches.  Hematological:  Negative for adenopathy. Does not bruise/bleed easily.  Psychiatric/Behavioral:  Negative for behavioral problems, decreased concentration and dysphoric mood.     Past Medical History:  Diagnosis Date   Migraines    Thyroid disease      Social History   Socioeconomic History   Marital status: Married    Spouse name: Not on file   Number of children: 2   Years of education: Not on file   Highest education level: Not on file  Occupational History   Occupation: uemployed  Tobacco Use   Smoking status: Never   Smokeless tobacco: Never  Vaping Use   Vaping Use: Never used  Substance and Sexual Activity   Alcohol use: Never   Drug use: Never   Sexual activity: Yes    Birth control/protection: I.U.D.  Other Topics Concern   Not on file  Social History  Narrative   Patient is right-handed. Patient lives with her husband and 2 children in 2 story home. She drinks 3 cups of tea a day. She exercise daily, yoga and walking.   Social Determinants of Health   Financial Resource Strain: Not on file  Food Insecurity: Not on file  Transportation Needs: Not on file  Physical Activity: Not on file  Stress: Not on file  Social Connections: Not on file  Intimate Partner Violence: Not on file    Past Surgical History:  Procedure Laterality Date   CESAREAN SECTION      Family History  Problem Relation Age of Onset   Hypertension Mother    Diabetes Father     No Known Allergies  Current Outpatient Medications on File Prior to Visit  Medication Sig Dispense Refill   levothyroxine (SYNTHROID) 112 MCG tablet Take 1 tablet (112 mcg total) by mouth daily at 6 (six) AM. 90 tablet 3   nortriptyline (PAMELOR) 25 MG capsule Take 1 capsule (25 mg total) by mouth at bedtime. 90 capsule 3   PARAGARD INTRAUTERINE COPPER IU by Intrauterine route.     rizatriptan (MAXALT-MLT) 10 MG disintegrating tablet Take 1 tablet (10 mg total) by mouth as needed for migraine (May repeat in 2 hours if needed. Maximum 2 tablets in 24 hours.) 10 tablet 5   SUMAtriptan (IMITREX) 100 MG tablet TAKE 1 TAB  EARLIEST ONSET OF MIGRAINE. MAY REPEAT IN 2 HOURS IF HEADACHE PERSISTS OR RECURS. MAX 2 TABS/24 HRS 10 tablet 5   Vitamin D, Ergocalciferol, (DRISDOL) 1.25 MG (50000 UT) CAPS capsule Take 1 capsule (50,000 Units total) by mouth every 7 (seven) days. 4 capsule 3   No current facility-administered medications on file prior to visit.    BP 90/62   Pulse 85   Temp 97.7 F (36.5 C)   Resp 18   Ht  (1.575 m)   Wt 121 lb (54.9 kg)   SpO2 100%   BMI 22.13 kg/m        Objective:   Physical Exam   General Mental Status- Alert. General Appearance- Not in acute distress.   Skin General: Color- Normal Color. Moisture- Normal Moisture.  Neck Carotid Arteries-  Normal color. Moisture- Normal Moisture. No carotid bruits. No JVD.  Chest and Lung Exam Auscultation: Breath Sounds:-Normal.  Cardiovascular Auscultation:Rythm- Regular. Murmurs & Other Heart Sounds:Auscultation of the heart reveals- No Murmurs.  Abdomen Inspection:-Inspeection Normal. Palpation/Percussion:Note:No mass. Palpation and Percussion of the abdomen reveal- Non Tender, Non Distended + BS, no rebound or guarding.   Neurologic Cranial Nerve exam:- CN III-XII intact(No nystagmus), symmetric smile. Strength:- 5/5 equal and symmetric strength both upper and lower extremities.      Assessment & Plan:  For you wellness exam today I have ordered cbc, cmp and  lipid panel.   Vaccine appear up to date.   Recommend  continue exercise and healthy diet.   We will let you know lab results as they come in.   Follow up date appointment will be determined after lab review.     For vit d deficiency will get vit D level.   Also for  hypothyroid tsh and t4.   Follow up date to be determined after lab review.   Esperanza Richters, PA-C

## 2023-03-23 NOTE — Addendum Note (Signed)
Addended by: Gwenevere Abbot on: 03/23/2023 08:37 PM   Modules accepted: Orders

## 2023-03-23 NOTE — Patient Instructions (Addendum)
For you wellness exam today I have ordered cbc, cmp and  lipid panel.   Vaccine appear up to date.   Recommend  continue exercise and healthy diet.   We will let you know lab results as they come in.   Follow up date appointment will be determined after lab review.     For vit d deficiency will get vit D level.   Also for  hypothyroid tsh and t4.   Follow up date to be determined after lab review.   Mammogram upcoming.Preventive Care 72-45 Years Old, Female Preventive care refers to lifestyle choices and visits with your health care provider that can promote health and wellness. Preventive care visits are also called wellness exams. What can I expect for my preventive care visit? Counseling Your health care provider may ask you questions about your: Medical history, including: Past medical problems. Family medical history. Pregnancy history. Current health, including: Menstrual cycle. Method of birth control. Emotional well-being. Home life and relationship well-being. Sexual activity and sexual health. Lifestyle, including: Alcohol, nicotine or tobacco, and drug use. Access to firearms. Diet, exercise, and sleep habits. Work and work Astronomer. Sunscreen use. Safety issues such as seatbelt and bike helmet use. Physical exam Your health care provider will check your: Height and weight. These may be used to calculate your BMI (body mass index). BMI is a measurement that tells if you are at a healthy weight. Waist circumference. This measures the distance around your waistline. This measurement also tells if you are at a healthy weight and may help predict your risk of certain diseases, such as type 2 diabetes and high blood pressure. Heart rate and blood pressure. Body temperature. Skin for abnormal spots. What immunizations do I need?  Vaccines are usually given at various ages, according to a schedule. Your health care provider will recommend vaccines for you based  on your age, medical history, and lifestyle or other factors, such as travel or where you work. What tests do I need? Screening Your health care provider may recommend screening tests for certain conditions. This may include: Lipid and cholesterol levels. Diabetes screening. This is done by checking your blood sugar (glucose) after you have not eaten for a while (fasting). Pelvic exam and Pap test. Hepatitis B test. Hepatitis C test. HIV (human immunodeficiency virus) test. STI (sexually transmitted infection) testing, if you are at risk. Lung cancer screening. Colorectal cancer screening. Mammogram. Talk with your health care provider about when you should start having regular mammograms. This may depend on whether you have a family history of breast cancer. BRCA-related cancer screening. This may be done if you have a family history of breast, ovarian, tubal, or peritoneal cancers. Bone density scan. This is done to screen for osteoporosis. Talk with your health care provider about your test results, treatment options, and if necessary, the need for more tests. Follow these instructions at home: Eating and drinking  Eat a diet that includes fresh fruits and vegetables, whole grains, lean protein, and low-fat dairy products. Take vitamin and mineral supplements as recommended by your health care provider. Do not drink alcohol if: Your health care provider tells you not to drink. You are pregnant, may be pregnant, or are planning to become pregnant. If you drink alcohol: Limit how much you have to 0-1 drink a day. Know how much alcohol is in your drink. In the [place] one drink equals one 12 oz bottle of beer (355 mL), one 5 oz glass of wine (148 mL),  or one 1 oz glass of hard liquor (44 mL). Lifestyle Brush your teeth every morning and night with fluoride toothpaste. Floss one time each day. Exercise for at least 30 minutes 5 or more days each week. Do not use any products that contain  nicotine or tobacco. These products include cigarettes, chewing tobacco, and vaping devices, such as e-cigarettes. If you need help quitting, ask your health care provider. Do not use drugs. If you are sexually active, practice safe sex. Use a condom or other form of protection to prevent STIs. If you do not wish to become pregnant, use a form of birth control. If you plan to become pregnant, see your health care provider for a prepregnancy visit. Take aspirin only as told by your health care provider. Make sure that you understand how much to take and what form to take. Work with your health care provider to find out whether it is safe and beneficial for you to take aspirin daily. Find healthy ways to manage stress, such as: Meditation, yoga, or listening to music. Journaling. Talking to a trusted person. Spending time with friends and family. Minimize exposure to UV radiation to reduce your risk of skin cancer. Safety Always wear your seat belt while driving or riding in a vehicle. Do not drive: If you have been drinking alcohol. Do not ride with someone who has been drinking. When you are tired or distracted. While texting. If you have been using any mind-altering substances or drugs. Wear a helmet and other protective equipment during sports activities. If you have firearms in your house, make sure you follow all gun safety procedures. Seek help if you have been physically or sexually abused. What's next? Visit your health care provider once a year for an annual wellness visit. Ask your health care provider how often you should have your eyes and teeth checked. Stay up to date on all vaccines. This information is not intended to replace advice given to you by your health care provider. Make sure you discuss any questions you have with your health care provider. Document Revised: 05/21/2021 Document Reviewed: 05/21/2021 Elsevier Patient Education  2023 [employer].    Recommend   continue exercise and healthy diet.   We will let you know lab results as they come in.   Follow up date appointment will be determined after lab review.     For vit d deficiency will get vit D level.   Also for  hypothyroid tsh and t4.   Follow up date to be determined after lab review.

## 2023-03-24 ENCOUNTER — Other Ambulatory Visit: Payer: Self-pay

## 2023-03-24 ENCOUNTER — Other Ambulatory Visit (HOSPITAL_COMMUNITY): Payer: Self-pay

## 2023-04-19 ENCOUNTER — Telehealth (HOSPITAL_BASED_OUTPATIENT_CLINIC_OR_DEPARTMENT_OTHER): Payer: Self-pay

## 2023-04-19 ENCOUNTER — Inpatient Hospital Stay (HOSPITAL_BASED_OUTPATIENT_CLINIC_OR_DEPARTMENT_OTHER): Admission: RE | Admit: 2023-04-19 | Payer: 59 | Source: Ambulatory Visit

## 2023-05-18 ENCOUNTER — Other Ambulatory Visit (HOSPITAL_COMMUNITY): Payer: Self-pay

## 2023-06-17 ENCOUNTER — Ambulatory Visit (HOSPITAL_BASED_OUTPATIENT_CLINIC_OR_DEPARTMENT_OTHER)
Admission: RE | Admit: 2023-06-17 | Discharge: 2023-06-17 | Disposition: A | Discharge status: Home / Self Care | Payer: 59 | Source: Ambulatory Visit | Attending: Obstetrics & Gynecology | Admitting: Obstetrics & Gynecology

## 2023-06-17 ENCOUNTER — Encounter (HOSPITAL_BASED_OUTPATIENT_CLINIC_OR_DEPARTMENT_OTHER): Payer: Self-pay

## 2023-06-17 DIAGNOSIS — Z1231 Encounter for screening mammogram for malignant neoplasm of breast: Secondary | ICD-10-CM | POA: Diagnosis not present

## 2023-07-20 NOTE — Progress Notes (Deleted)
NEUROLOGY FOLLOW UP OFFICE NOTE  WINNA VOLLE [id number]  Assessment/Plan:     Migraine without aura, without status migrainosus, not intractable Probable essential tremor - infrequent and not requiring propranolol or other medication at this time.   Migraine prevention:  Nortriptyline 25mg  at bedtime  Migraine rescue:  Rizatriptan 10mg  (she wants sumatriptan on-hand as well - advised to limit use of triptans and all other analgesics to no more than 2 days out of week) Tremor:  monitor *** Follow up 6 months ***.     Subjective:  Kristina Sherman is a 47 year old woman who follows up for migraine.   UPDATE: Intensity:  moderate-severe Duration:  brief with rizatritan. Frequency:  1 day in last month  Tremors are not bothersome.  She notices it if she is using her hands fast.     Rescue protocol:  rizatriptan Current NSAIDS:  none Current analgesics:  none Current triptans: rizatriptan 10mg  Current ergotamine:  none Current anti-emetic:  none Current muscle relaxants:  cyclobenzaprine 10mg  PRN (neck pain) Current anti-anxiolytic:  none Current sleep aide:  none Current Antihypertensive medications:  none Current Antidepressant medications:  nortriptyline 25mg  at bedtime Current Anticonvulsant medications: none Current anti-CGRP:  none Current Vitamins/Herbal/Supplements:  D Current Antihistamines/Decongestants:  none Other therapy:  none Hormone/birth control:  none Other medications:  Synthroid   Caffeine:  2 to 4 cups of tea daily Diet:  2-3 16 oz bottles of water daily Exercise:  yes Depression:  no; Anxiety:  no Other pain:  no Sleep hygiene:  varies   HISTORY:  Onset: Since childhood.  More frequent since around 2015-2016 Location:  left sided from behind left eye to back of head, sometimes right side Quality:  Stabbing needle Initial intensity:  7/10.  She denies new headache, thunderclap headache Aura:  no Premonitory Phase:  no Postdrome:   no Associated symptoms:  When severe there is associated nausea and vomiting.  She denies associated photophobia, phonophobia, visual disturbance, unilateral numbness or weakness. Initial duration:  2 to 5 days. Initial Frequency:  On average it occurs every 6 weeks.  When it occurs, she often will wake up in the middle of the night with it. Initial Frequency of abortive medication: 2 to 5 days every 6 weeks Triggers: Sleep deprivation, jumping, loud talking, cold air Relieving factors: None Activity:  aggravates  In 2022, she has noticed a tremor in her right hand.  Noticeable with action or holding objects.  Does not involve the left hand.  No known family history of tremor.  MRI of brain with and without contrast on 09/26/2021 was normal.    Past NSAIDS:  Mobic Past analgesics:  Excedrin Past abortive triptans:  sumatriptan 100mg  Past abortive ergotamine:  none Past muscle relaxants:  none Past anti-emetic:  Promethazine 25mg  Past antihypertensive medications:  none Past antidepressant medications:  none Past anticonvulsant medications:    topiramate 50mg  twice daily (dizziness, memory problems) Past anti-CGRP:  Nurtec (rescue- ineffective) Past vitamins/Herbal/Supplements:  none Past antihistamines/decongestants:  none Other past therapies:  none   Family history of headache:  no  PAST MEDICAL HISTORY: Past Medical History:  Diagnosis Date   Migraines    Thyroid disease     MEDICATIONS: Current Outpatient Medications on File Prior to Visit  Medication Sig Dispense Refill   levothyroxine (SYNTHROID) 112 MCG tablet Take 1 tablet (112 mcg total) by mouth daily at 6 (six) AM. 90 tablet 3   nortriptyline (PAMELOR) 25 MG capsule Take 1 capsule (  25 mg total) by mouth at bedtime. 90 capsule 3   PARAGARD INTRAUTERINE COPPER IU by Intrauterine route.     rizatriptan (MAXALT-MLT) 10 MG disintegrating tablet Take 1 tablet (10 mg total) by mouth as needed for migraine (May repeat in 2  hours if needed. Maximum 2 tablets in 24 hours.) 10 tablet 5   SUMAtriptan (IMITREX) 100 MG tablet TAKE 1 TAB EARLIEST ONSET OF MIGRAINE. MAY REPEAT IN 2 HOURS IF HEADACHE PERSISTS OR RECURS. MAX 2 TABS/24 HRS 10 tablet 5   Vitamin D, Ergocalciferol, (DRISDOL) 1.25 MG (50000 UNIT) CAPS capsule Take 1 capsule (50,000 Units total) by mouth every 7 (seven) days. 8 capsule 0   Vitamin D, Ergocalciferol, (DRISDOL) 1.25 MG (50000 UT) CAPS capsule Take 1 capsule (50,000 Units total) by mouth every 7 (seven) days. 4 capsule 3   No current facility-administered medications on file prior to visit.    ALLERGIES: No Known Allergies  FAMILY HISTORY: Family History  Problem Relation Age of Onset   Hypertension Mother    Diabetes Father       Objective:  *** General: No acute distress.  Patient appears well-groomed.   Head:  Normocephalic/atraumatic Eyes:  Fundi examined but not visualized Neck: supple, no paraspinal tenderness, full range of motion Heart:  Regular rate and rhythm Neurological Exam: alert and oriented.  Speech fluent and not dysarthric, language intact.  CN II-XII intact. Bulk and tone normal, muscle strength 5/5 throughout.   Maybe very slight fine tremor in hands when outstretched.  Sensation to light touch intact.  Deep tendon reflexes 2+ throughout.  Finger to nose testing intact.  Gait normal, Romberg negative. ***   Kristina Millet, DO  CC: Esperanza Richters, PA-C

## 2023-07-21 ENCOUNTER — Encounter: Payer: Self-pay | Admitting: Neurology

## 2023-07-21 ENCOUNTER — Ambulatory Visit: Payer: 59 | Admitting: Neurology

## 2023-07-21 DIAGNOSIS — Z029 Encounter for administrative examinations, unspecified: Secondary | ICD-10-CM

## 2023-09-04 DIAGNOSIS — H5213 Myopia, bilateral: Secondary | ICD-10-CM | POA: Diagnosis not present

## 2023-09-04 DIAGNOSIS — H52223 Regular astigmatism, bilateral: Secondary | ICD-10-CM | POA: Diagnosis not present

## 2023-09-04 DIAGNOSIS — H524 Presbyopia: Secondary | ICD-10-CM | POA: Diagnosis not present

## 2023-10-18 ENCOUNTER — Other Ambulatory Visit (HOSPITAL_COMMUNITY): Payer: Self-pay

## 2024-01-04 ENCOUNTER — Other Ambulatory Visit: Payer: Self-pay

## 2024-01-05 ENCOUNTER — Encounter: Payer: 59 | Admitting: Medical

## 2024-01-12 ENCOUNTER — Other Ambulatory Visit (HOSPITAL_BASED_OUTPATIENT_CLINIC_OR_DEPARTMENT_OTHER): Payer: Self-pay | Admitting: Medical

## 2024-01-12 DIAGNOSIS — Z139 Encounter for screening, unspecified: Secondary | ICD-10-CM

## 2024-01-13 NOTE — Progress Notes (Signed)
 NEUROLOGY FOLLOW UP OFFICE NOTE  [name] [id number]  Assessment/Plan:   I  Migraine without aura, without status migrainosus, not intractable II  Probable essential tremor   Migraine prevention:  Nortriptyline  25mg  at bedtime  Migraine rescue:  sumatriptan  100mg  Follow up 1 year     Subjective:  Kristina Sherman is a 48 year old woman who follows up for migraine.   UPDATE: Intensity:  moderate-severe Duration:  brief with sumatriptan  Frequency:  0 to 1 a month on average  Tremors are not bothersome.  She notices it if she is using her hands fast.     Rescue protocol:  sumatriptan  Current NSAIDS:  none Current analgesics:  none Current triptans: sumatriptan  100mg  Current ergotamine:  none Current anti-emetic:  none Current muscle relaxants: none Current anti-anxiolytic:  none Current sleep aide:  none Current Antihypertensive medications:  none Current Antidepressant medications:  nortriptyline  25mg  at bedtime Current Anticonvulsant medications: none Current anti-CGRP:  none Current Vitamins/Herbal/Supplements:  D Current Antihistamines/Decongestants:  none Other therapy:  none Hormone/birth control:  none Other medications:  Synthroid    Caffeine:  2 to 4 cups of tea daily Diet:  2-3 16 oz bottles of water daily Exercise:  yes Depression:  no; Anxiety:  no Other pain:  no Sleep hygiene:  varies   HISTORY:  Onset: Since childhood.  More frequent since around 2015-2016 Location:  left sided from behind left eye to back of head, sometimes right side Quality:  Stabbing needle Initial intensity:  7/10.  She denies new headache, thunderclap headache Aura:  no Premonitory Phase:  no Postdrome:  no Associated symptoms:  When severe there is associated nausea and vomiting.  She denies associated photophobia, phonophobia, visual disturbance, unilateral numbness or weakness. Initial duration:  2 to 5 days. Initial Frequency:  On average it occurs every 6 weeks.   When it occurs, she often will wake up in the middle of the night with it. Initial Frequency of abortive medication: 2 to 5 days every 6 weeks Triggers: Sleep deprivation, jumping, loud talking, cold air Relieving factors: None Activity:  aggravates  In 2022, she has noticed a tremor in her right hand.  Noticeable with action or holding objects.  Does not involve the left hand.  No known family history of tremor.  MRI of brain with and without contrast on 09/26/2021 was normal.    Past NSAIDS:  Mobic  Past analgesics:  Excedrin Past abortive triptans:  rizatriptan  10mg  Past abortive ergotamine:  none Past muscle relaxants:  Flexeril  Past anti-emetic:  Promethazine 25mg  Past antihypertensive medications:  none Past antidepressant medications:  none Past anticonvulsant medications:    topiramate  50mg  twice daily (dizziness, memory problems) Past anti-CGRP:  Nurtec (rescue- ineffective) Past vitamins/Herbal/Supplements:  none Past antihistamines/decongestants:  none Other past therapies:  none   Family history of headache:  no  PAST MEDICAL HISTORY: Past Medical History:  Diagnosis Date   Migraines    Thyroid  disease     MEDICATIONS: Current Outpatient Medications on File Prior to Visit  Medication Sig Dispense Refill   levothyroxine  (SYNTHROID ) 112 MCG tablet Take 1 tablet (112 mcg total) by mouth daily at 6 (six) AM. 90 tablet 3   nortriptyline  (PAMELOR ) 25 MG capsule Take 1 capsule (25 mg total) by mouth at bedtime. 90 capsule 3   PARAGARD  INTRAUTERINE COPPER  IU by Intrauterine route.     rizatriptan  (MAXALT -MLT) 10 MG disintegrating tablet Take 1 tablet (10 mg total) by mouth as needed for migraine (May repeat in  2 hours if needed. Maximum 2 tablets in 24 hours.) 10 tablet 5   SUMAtriptan  (IMITREX ) 100 MG tablet TAKE 1 TAB EARLIEST ONSET OF MIGRAINE. MAY REPEAT IN 2 HOURS IF HEADACHE PERSISTS OR RECURS. MAX 2 TABS/24 HRS 10 tablet 5   Vitamin D , Ergocalciferol , (DRISDOL ) 1.25  MG (50000 UNIT) CAPS capsule Take 1 capsule (50,000 Units total) by mouth every 7 (seven) days. 8 capsule 0   Vitamin D , Ergocalciferol , (DRISDOL ) 1.25 MG (50000 UT) CAPS capsule Take 1 capsule (50,000 Units total) by mouth every 7 (seven) days. 4 capsule 3   No current facility-administered medications on file prior to visit.    ALLERGIES: No Known Allergies  FAMILY HISTORY: Family History  Problem Relation Age of Onset   Hypertension Mother    Diabetes Father       Objective:  Blood pressure 108/71, pulse 77, height 5' 1 (1.549 m), weight 123 lb (55.8 kg), SpO2 100%. General: No acute distress.  Patient appears well-groomed.   Head:  Normocephalic/atraumatic Neck:  Supple.  No paraspinal tenderness.  Full range of motion. Heart:  Regular rate and rhythm. Neuro:  Alert and oriented.  Speech fluent and not dysarthric.  Language intact.  CN II-XII intact.  Bulk and tone normal.  Muscle strength 5/5 throughout.  Deep tendon reflexes 2+ throughout.  Gait normal.  Romberg negative.    Juliene Dunnings, DO  CC: Edward Saguier, PA-C

## 2024-01-14 ENCOUNTER — Ambulatory Visit (INDEPENDENT_AMBULATORY_CARE_PROVIDER_SITE_OTHER): Payer: 59 | Admitting: Neurology

## 2024-01-14 ENCOUNTER — Other Ambulatory Visit (HOSPITAL_COMMUNITY): Payer: Self-pay

## 2024-01-14 ENCOUNTER — Encounter: Payer: Self-pay | Admitting: Neurology

## 2024-01-14 DIAGNOSIS — G43009 Migraine without aura, not intractable, without status migrainosus: Secondary | ICD-10-CM

## 2024-01-14 MED ORDER — NORTRIPTYLINE HCL 25 MG PO CAPS
25.0000 mg | ORAL_CAPSULE | Freq: Every day | ORAL | 3 refills | Status: AC
  Filled 2024-01-14 – 2024-03-29 (×2): qty 90, 90d supply, fill #0
  Filled 2024-07-11 (×2): qty 90, 90d supply, fill #1
  Filled 2024-10-09: qty 90, 90d supply, fill #2

## 2024-01-14 MED ORDER — SUMATRIPTAN SUCCINATE 100 MG PO TABS
ORAL_TABLET | ORAL | 3 refills | Status: AC
  Filled 2024-01-14: qty 9, 30d supply, fill #0
  Filled 2024-03-01: qty 9, 30d supply, fill #1
  Filled 2024-03-29: qty 9, 30d supply, fill #2
  Filled 2024-05-14: qty 9, 30d supply, fill #3
  Filled 2024-06-26: qty 9, 30d supply, fill #4
  Filled 2024-08-29: qty 9, 30d supply, fill #5
  Filled 2024-09-29: qty 9, 30d supply, fill #6
  Filled 2024-10-09 – 2024-10-10 (×2): qty 9, 30d supply, fill #7
  Filled 2024-11-24: qty 9, 30d supply, fill #8
  Filled 2024-12-21: qty 9, 30d supply, fill #9

## 2024-02-02 ENCOUNTER — Ambulatory Visit: Payer: 59 | Admitting: Obstetrics & Gynecology

## 2024-02-04 IMAGING — MG MM DIGITAL SCREENING BILAT W/ TOMO AND CAD
8 series · 8 of 24 positions shown · non-contrast
Comparison: Previous exam(s).

CLINICAL DATA: Screening.

EXAM:
DIGITAL SCREENING BILATERAL MAMMOGRAM WITH TOMOSYNTHESIS AND CAD
TECHNIQUE: Bilateral screening digital craniocaudal and mediolateral oblique
mammograms were obtained. Bilateral screening digital breast
tomosynthesis was performed. The images were evaluated with
computer-aided detection.

[R MLO synth-2D]
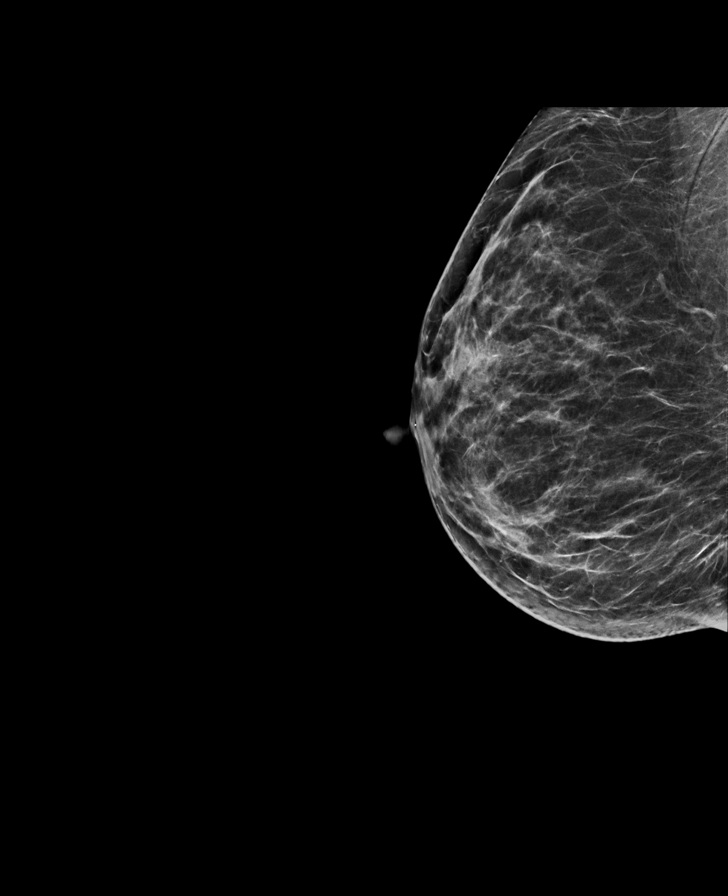

[L CC synth-2D]
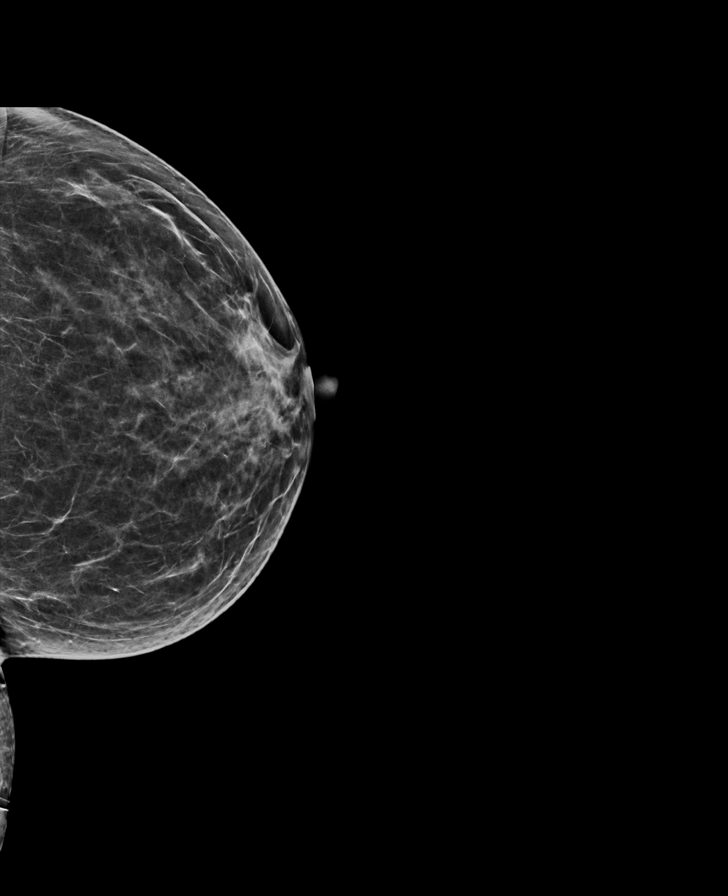

[L MLO synth-2D]
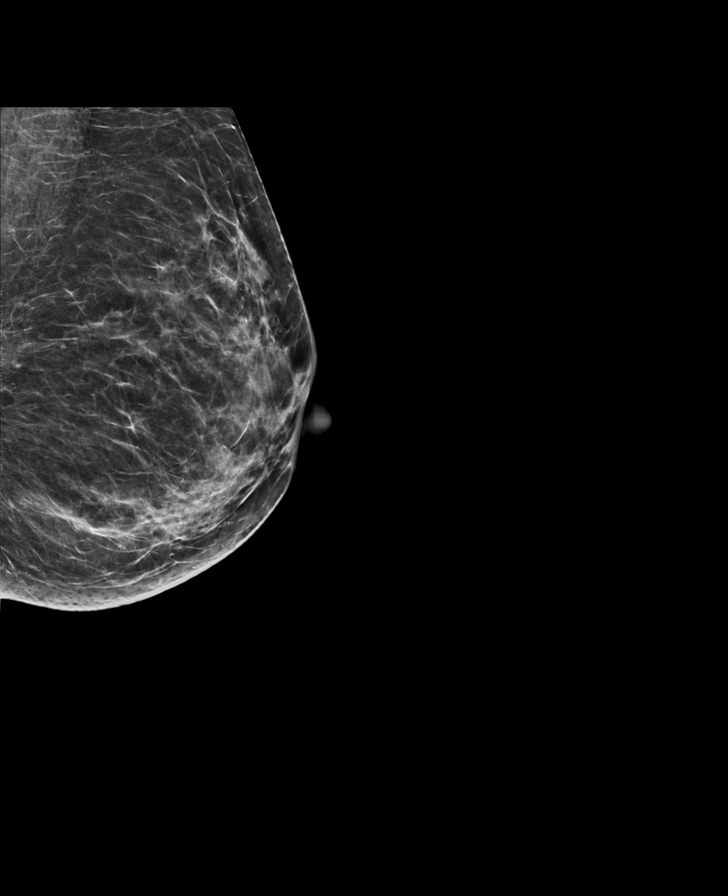

[R CC synth-2D]
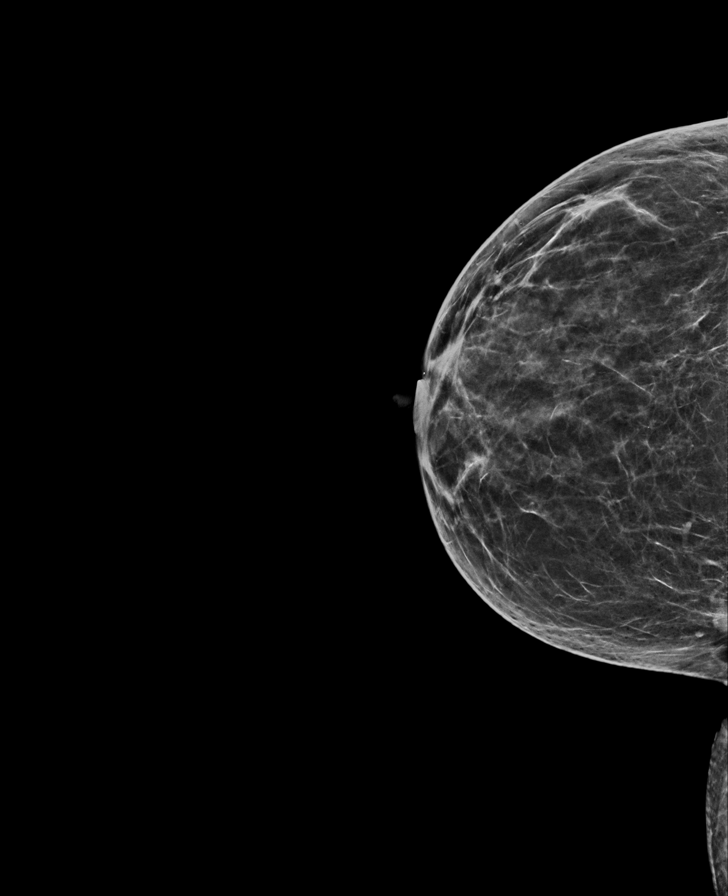

[L MLO tomo · tomo slice 31/62.0]
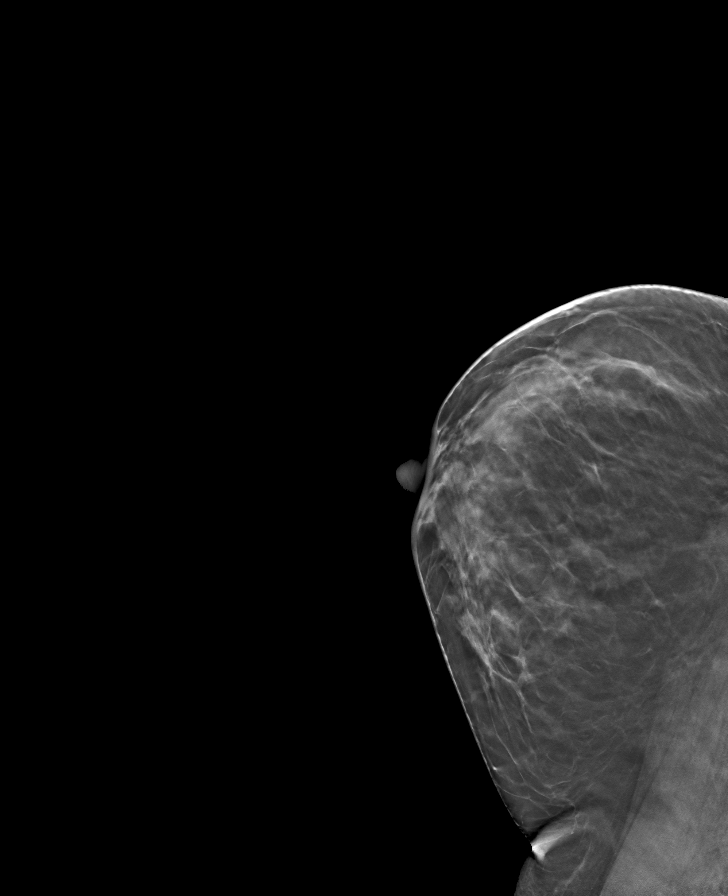

[R MLO tomo · tomo slice 31/60.0]
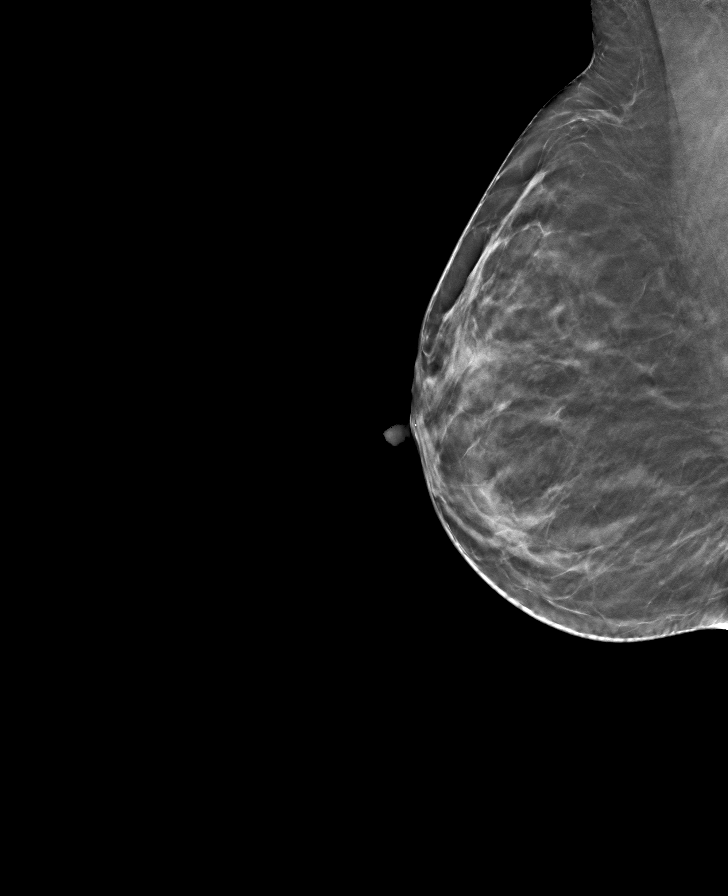

[L CC tomo · tomo slice 30/59.0]
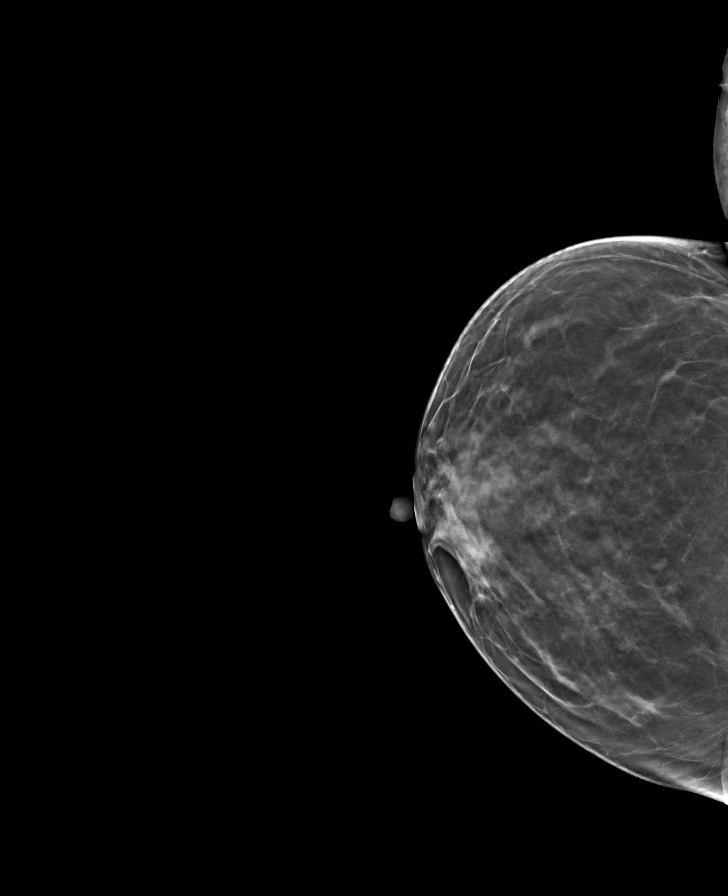

[R CC tomo · tomo slice 29/58.0]
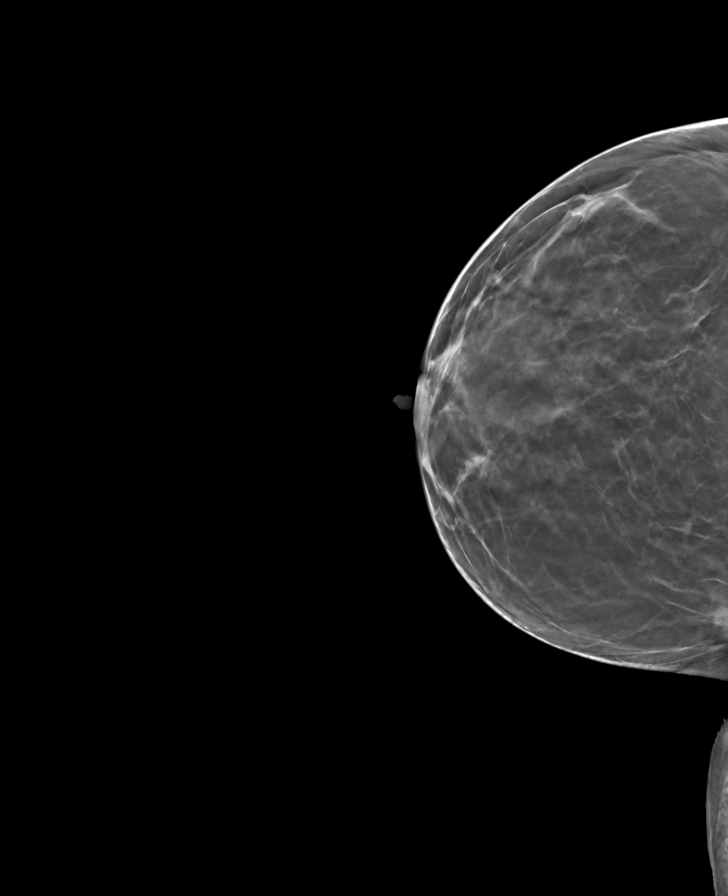

[8 of 24 positions shown; findings below may reference images not displayed]

ACR Breast Density Category b: There are scattered areas of
fibroglandular density.
FINDINGS: There are no findings suspicious for malignancy.
IMPRESSION: No mammographic evidence of malignancy. A result letter of this
screening mammogram will be mailed directly to the patient.

RECOMMENDATION:
Screening mammogram in one year. (Code:51-O-LD2)

BI-RADS CATEGORY  1: Negative.

## 2024-02-16 ENCOUNTER — Encounter: Payer: Self-pay | Admitting: Obstetrics & Gynecology

## 2024-02-16 ENCOUNTER — Ambulatory Visit (INDEPENDENT_AMBULATORY_CARE_PROVIDER_SITE_OTHER): Payer: 59 | Admitting: Obstetrics & Gynecology

## 2024-02-16 VITALS — BP 105/67 | HR 84 | Ht 62.0 in | Wt 122.0 lb

## 2024-02-16 DIAGNOSIS — Z01419 Encounter for gynecological examination (general) (routine) without abnormal findings: Secondary | ICD-10-CM

## 2024-02-16 DIAGNOSIS — Z1231 Encounter for screening mammogram for malignant neoplasm of breast: Secondary | ICD-10-CM

## 2024-02-16 DIAGNOSIS — Z1211 Encounter for screening for malignant neoplasm of colon: Secondary | ICD-10-CM | POA: Diagnosis not present

## 2024-02-16 NOTE — Progress Notes (Signed)
 Subjective:     Kristina Sherman is a 48 y.o. female here for a routine exam.  Current complaints: none. Pt reports monthly cycles. No problems with her Paragard IUD.  She has never had a colonoscopy. She is UTD with her mammograms. She gets them annually in July.     Gynecologic History No LMP recorded. (Menstrual status: IUD). Contraception: IUD Last Pap: 1/524/2024. Results were: normal Last mammogram: 06/17/2023. Results were: normal  Obstetric History OB History  Gravida Para Term Preterm AB Living  2 2 2   2   SAB IAB Ectopic Multiple Live Births      2    # Outcome Date GA Lbr Len/2nd Weight Sex Type Anes PTL Lv  2 Term 2013 [redacted]w[redacted]d   F CS-LTranv Spinal N LIV  1 Term 2007 [redacted]w[redacted]d   M CS-LTranv EPI N LIV   The following portions of the patient's history were reviewed and updated as appropriate: allergies, current medications, past family history, past medical history, past social history, past surgical history, and problem list.  Review of Systems Pertinent items are noted in HPI.    Objective:  BP 105/67 (BP Location: Left Arm, Patient Position: Sitting, Cuff Size: Normal)   Pulse 84   Ht 5\' 2"  (1.575 m)   Wt 122 lb (55.3 kg)   BMI 22.31 kg/m  General Appearance:    Alert, cooperative, no distress, appears stated age  Head:    Normocephalic, without obvious abnormality, atraumatic  Eyes:    conjunctiva/corneas clear, EOM's intact, both eyes  Ears:    Normal external ear canals, both ears  Nose:   Nares normal, septum midline, mucosa normal, no drainage    or sinus tenderness  Throat:   Lips, mucosa, and tongue normal; teeth and gums normal  Neck:   Supple, symmetrical, trachea midline, no adenopathy;    thyroid:  no enlargement/tenderness/nodules  Back:     Symmetric, no curvature, ROM normal, no CVA tenderness  Lungs:     respirations unlabored  Chest Wall:    No tenderness or deformity   Heart:    Regular rate and rhythm  Breast Exam:    No tenderness, masses, or nipple  abnormality  Abdomen:     Soft, non-tender, bowel sounds active all four quadrants,    no masses, no organomegaly  Genitalia:    Normal female without lesion, discharge or tenderness   IUD strings noted.   Extremities:   Extremities normal, atraumatic, no cyanosis or edema  Pulses:   2+ and symmetric all extremities  Skin:   Skin color, texture, turgor normal, no rashes or lesions      Assessment:    Healthy female exam.    Plan:  Kristina Sherman was seen today for gynecologic exam.  Diagnoses and all orders for this visit:  Well female exam with routine gynecological exam  Colon cancer screening -     Ambulatory referral to Gastroenterology  Breast cancer screening by mammogram   Will make appt in July.   F/u in 1 year or sooner prn   Kristina Sherman L. Harraway-Smith, M.D., [facility]

## 2024-03-29 ENCOUNTER — Other Ambulatory Visit: Payer: Self-pay

## 2024-03-29 ENCOUNTER — Other Ambulatory Visit (HOSPITAL_COMMUNITY): Payer: Self-pay

## 2024-04-13 ENCOUNTER — Other Ambulatory Visit: Payer: Self-pay | Admitting: Medical

## 2024-04-13 ENCOUNTER — Other Ambulatory Visit (HOSPITAL_COMMUNITY): Payer: Self-pay

## 2024-04-13 DIAGNOSIS — E039 Hypothyroidism, unspecified: Secondary | ICD-10-CM

## 2024-04-13 NOTE — Telephone Encounter (Signed)
 Appt tomorrow.

## 2024-04-14 ENCOUNTER — Ambulatory Visit (INDEPENDENT_AMBULATORY_CARE_PROVIDER_SITE_OTHER): Payer: 59 | Admitting: Medical

## 2024-04-14 ENCOUNTER — Encounter: Payer: Self-pay | Admitting: Medical

## 2024-04-14 VITALS — BP 99/64 | HR 67 | Temp 98.1°F | Resp 16 | Ht 62.0 in | Wt 119.0 lb

## 2024-04-14 DIAGNOSIS — Z1211 Encounter for screening for malignant neoplasm of colon: Secondary | ICD-10-CM

## 2024-04-14 DIAGNOSIS — Z0000 Encounter for general adult medical examination without abnormal findings: Secondary | ICD-10-CM

## 2024-04-14 DIAGNOSIS — D649 Anemia, unspecified: Secondary | ICD-10-CM

## 2024-04-14 DIAGNOSIS — E559 Vitamin D deficiency, unspecified: Secondary | ICD-10-CM

## 2024-04-14 DIAGNOSIS — E039 Hypothyroidism, unspecified: Secondary | ICD-10-CM

## 2024-04-14 LAB — CBC WITH DIFFERENTIAL/PLATELET
Basophils Absolute: 0.1 10*3/uL (ref 0.0–0.1)
Basophils Relative: 1.4 % (ref 0.0–3.0)
Eosinophils Absolute: 0.1 10*3/uL (ref 0.0–0.7)
Eosinophils Relative: 1.6 % (ref 0.0–5.0)
HCT: 35.9 % — ABNORMAL LOW (ref 36.0–46.0)
Hemoglobin: 11.5 g/dL — ABNORMAL LOW (ref 12.0–15.0)
Lymphocytes Relative: 26.8 % (ref 12.0–46.0)
Lymphs Abs: 1.1 10*3/uL (ref 0.7–4.0)
MCHC: 32 g/dL (ref 30.0–36.0)
MCV: 77.9 fl — ABNORMAL LOW (ref 78.0–100.0)
Monocytes Absolute: 0.4 10*3/uL (ref 0.1–1.0)
Monocytes Relative: 9.2 % (ref 3.0–12.0)
Neutro Abs: 2.5 10*3/uL (ref 1.4–7.7)
Neutrophils Relative %: 61 % (ref 43.0–77.0)
Platelets: 218 10*3/uL (ref 150.0–400.0)
RBC: 4.61 Mil/uL (ref 3.87–5.11)
RDW: 15.3 % (ref 11.5–15.5)
WBC: 4.2 10*3/uL (ref 4.0–10.5)

## 2024-04-14 LAB — IRON,TIBC AND FERRITIN PANEL
%SAT: 11 % — ABNORMAL LOW (ref 16–45)
Ferritin: 5 ng/mL — ABNORMAL LOW (ref 16–232)
Iron: 48 ug/dL (ref 40–190)
TIBC: 453 ug/dL — ABNORMAL HIGH (ref 250–450)

## 2024-04-14 LAB — LIPID PANEL
Cholesterol: 179 mg/dL (ref 0–200)
HDL: 75.3 mg/dL (ref >39.00)
LDL Cholesterol: 96 mg/dL (ref 0–99)
NonHDL: 103.69
Total CHOL/HDL Ratio: 2
Triglycerides: 37 mg/dL (ref 0.0–149.0)
VLDL: 7.4 mg/dL (ref 0.0–40.0)

## 2024-04-14 LAB — COMPREHENSIVE METABOLIC PANEL WITH GFR
ALT: 5 U/L (ref 0–35)
AST: 20 U/L (ref 0–37)
Albumin: 4.7 g/dL (ref 3.5–5.2)
Alkaline Phosphatase: 70 U/L (ref 39–117)
BUN: 11 mg/dL (ref 6–23)
CO2: 27 meq/L (ref 19–32)
Calcium: 9.4 mg/dL (ref 8.4–10.5)
Chloride: 104 meq/L (ref 96–112)
Creatinine, Ser: 0.9 mg/dL (ref 0.40–1.20)
GFR: 77.09 mL/min (ref >60.00)
Glucose, Bld: 79 mg/dL (ref 70–99)
Potassium: 4.3 meq/L (ref 3.5–5.1)
Sodium: 138 meq/L (ref 135–145)
Total Bilirubin: 1 mg/dL (ref 0.2–1.2)
Total Protein: 7.8 g/dL (ref 6.0–8.3)

## 2024-04-14 LAB — T4, FREE: Free T4: 0.92 ng/dL (ref 0.60–1.60)

## 2024-04-14 LAB — TSH: TSH: 1.3 u[IU]/mL (ref 0.35–5.50)

## 2024-04-14 MED ORDER — LEVOTHYROXINE SODIUM 112 MCG PO TABS
112.0000 ug | ORAL_TABLET | Freq: Every day | ORAL | 3 refills | Status: AC
Start: 2024-04-14 — End: ?
  Filled 2024-04-14: qty 30, 30d supply, fill #0
  Filled 2024-04-17: qty 10, 10d supply, fill #0
  Filled 2024-04-18: qty 20, 20d supply, fill #0
  Filled 2024-05-14: qty 30, 30d supply, fill #1
  Filled 2024-06-26: qty 30, 30d supply, fill #2
  Filled 2024-07-27: qty 30, 30d supply, fill #3
  Filled 2024-08-29: qty 30, 30d supply, fill #4
  Filled 2024-09-29: qty 30, 30d supply, fill #5
  Filled 2024-10-30: qty 30, 30d supply, fill #6
  Filled 2024-11-24: qty 30, 30d supply, fill #7
  Filled 2024-12-21: qty 30, 30d supply, fill #8

## 2024-04-14 NOTE — Progress Notes (Signed)
 Subjective:    Patient ID: Kristina Sherman, female    DOB: 05-Nov-1976, 48 y.o.   MRN: [id number]  HPI Pt in for wellness exam.   Pt is fasting. Decided to go ahead and do wellness exam.    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 Pt walks 5 days a week. Pt diet is healthy. Pt drinks tea 2-3 times. Non smoker. No alcohol.    Pt is up to date on papsmear. Up to date on mammogram   Pt had low thyroid . Will recheck tsh and t4 today.   Low vit d in past. She taking vit d gummy supplament.    Pt had slight anemia last year. No black or bloody stool. Pt is vegitarian.    Pt has not had colonoscopy. Will place referral for that today.   Review of Systems  Constitutional:  Negative for chills, fatigue and fever.  HENT:  Negative for congestion, ear discharge and ear pain.   Respiratory:  Negative for cough, chest tightness, shortness of breath and wheezing.   Cardiovascular:  Negative for chest pain and palpitations.  Gastrointestinal:  Negative for abdominal pain, constipation, diarrhea and nausea.  Genitourinary:  Negative for dyspareunia, dysuria, flank pain, frequency and urgency.  Musculoskeletal:  Negative for back pain and gait problem.  Skin:  Negative for rash.  Neurological:  Negative for dizziness, speech difficulty, weakness, numbness and headaches.  Hematological:  Negative for adenopathy. Does not bruise/bleed easily.  Psychiatric/Behavioral:  Negative for behavioral problems, decreased concentration and dysphoric mood.     Past Medical History:  Diagnosis Date   Migraines    Thyroid  disease      Social History   Socioeconomic History   Marital status: Married    Spouse name: Not on file   Number of children: 2   Years of education: Not on file   Highest education level: Not on file  Occupational History   Occupation: uemployed  Tobacco Use   Smoking status: Never   Smokeless tobacco: Never  Vaping Use   Vaping status: Never Used  Substance and Sexual Activity    Alcohol use: Never   Drug use: Never   Sexual activity: Yes    Birth control/protection: I.U.D.  Other Topics Concern   Not on file  Social History Narrative   Patient is right-handed. Patient lives with her husband and 2 children in 2 story home. She drinks 3 cups of tea a day. She exercise daily, yoga and walking.   Social Drivers of Corporate investment banker Strain: Not on file  Food Insecurity: Not on file  Transportation Needs: Not on file  Physical Activity: Not on file  Stress: Not on file  Social Connections: Not on file  Intimate Partner Violence: Not on file    Past Surgical History:  Procedure Laterality Date   CESAREAN SECTION      Family History  Problem Relation Age of Onset   Hypertension Mother    Diabetes Father     No Known Allergies  Current Outpatient Medications on File Prior to Visit  Medication Sig Dispense Refill   levothyroxine  (SYNTHROID ) 112 MCG tablet Take 1 tablet (112 mcg total) by mouth daily at 6 (six) AM. 90 tablet 3   nortriptyline  (PAMELOR ) 25 MG capsule Take 1 capsule (25 mg total) by mouth at bedtime. 90 capsule 3   PARAGARD  INTRAUTERINE COPPER  IU by Intrauterine route.     SUMAtriptan  (IMITREX ) 100 MG tablet TAKE 1 TABLET AT EARLIEST  ONSET OF MIGRAINE. MAY REPEAT IN 2 HOURS IF HEADACHE PERSISTS OR RECURS. MAX 2 TABS/24 HRS 30 tablet 3   Vitamin D , Ergocalciferol , (DRISDOL ) 1.25 MG (50000 UNIT) CAPS capsule Take 1 capsule (50,000 Units total) by mouth every 7 (seven) days. 8 capsule 0   Vitamin D , Ergocalciferol , (DRISDOL ) 1.25 MG (50000 UT) CAPS capsule Take 1 capsule (50,000 Units total) by mouth every 7 (seven) days. 4 capsule 3   No current facility-administered medications on file prior to visit.    BP 99/64   Pulse 67   Temp 98.1 F (36.7 C) (Oral)   Resp 16   Ht 5\' 2"  (1.575 m)   Wt 119 lb (54 kg)   LMP 03/12/2024 (Approximate)   SpO2 100%   BMI 21.77 kg/m        Objective:   Physical Exam   General Mental  Status- Alert. General Appearance- Not in acute distress.   Skin General: Color- Normal Color. Moisture- Normal Moisture.  Neck Carotid Arteries- Normal color. Moisture- Normal Moisture. No carotid bruits. No JVD.  Chest and Lung Exam Auscultation: Breath Sounds:-CTA  Cardiovascular Auscultation:Rythm- RRR Murmurs & Other Heart Sounds:Auscultation of the heart reveals- No Murmurs.  Abdomen Inspection:-Inspeection Normal. Palpation/Percussion:Note:No mass. Palpation and Percussion of the abdomen reveal- Non Tender, Non Distended + BS, no rebound or guarding.   Neurologic Cranial Nerve exam:- CN III-XII intact(No nystagmus), symmetric smile. Strength:- 5/5 equal and symmetric strength both upper and lower extremities.      Assessment & Plan:   Patient Instructions  For you wellness exam today I have ordered cbc, cmp and lipid panel.  For hypothyroid get tsh and t4.  For low vit d get vit d level.  For mild anemia add iron panel  Vaccines up to date.  Refer to screening colon CA/colonoocpy  Recommend  continue exercise and healthy diet.  We will let you know lab results as they come in.  Follow up date appointment will be determined after lab review.      Tryston Gilliam, PA-C

## 2024-04-14 NOTE — Patient Instructions (Addendum)
 For you wellness exam today I have ordered cbc, cmp and lipid panel.  For hypothyroid get tsh and t4.  For low vit d get vit d level.  For mild anemia add iron panel  Vaccines up to date.  Refer to screening colon CA/colonoscopy  Recommend  continue exercise and healthy diet.  We will let you know lab results as they come in.  Follow up date appointment will be determined after lab review.    Preventive Care 57-46 Years Old, Female Preventive care refers to lifestyle choices and visits with your health care provider that can promote health and wellness. Preventive care visits are also called wellness exams. What can I expect for my preventive care visit? Counseling Your health care provider may ask you questions about your: Medical history, including: Past medical problems. Family medical history. Pregnancy history. Current health, including: Menstrual cycle. Method of birth control. Emotional well-being. Home life and relationship well-being. Sexual activity and sexual health. Lifestyle, including: Alcohol, nicotine or tobacco, and drug use. Access to firearms. Diet, exercise, and sleep habits. Work and work Astronomer. Sunscreen use. Safety issues such as seatbelt and bike helmet use. Physical exam Your health care provider will check your: Height and weight. These may be used to calculate your BMI (body mass index). BMI is a measurement that tells if you are at a healthy weight. Waist circumference. This measures the distance around your waistline. This measurement also tells if you are at a healthy weight and may help predict your risk of certain diseases, such as type 2 diabetes and high blood pressure. Heart rate and blood pressure. Body temperature. Skin for abnormal spots. What immunizations do I need?  Vaccines are usually given at various ages, according to a schedule. Your health care provider will recommend vaccines for you based on your age, medical  history, and lifestyle or other factors, such as travel or where you work. What tests do I need? Screening Your health care provider may recommend screening tests for certain conditions. This may include: Lipid and cholesterol levels. Diabetes screening. This is done by checking your blood sugar (glucose) after you have not eaten for a while (fasting). Pelvic exam and Pap test. Hepatitis B test. Hepatitis C test. HIV (human immunodeficiency virus) test. STI (sexually transmitted infection) testing, if you are at risk. Lung cancer screening. Colorectal cancer screening. Mammogram. Talk with your health care provider about when you should start having regular mammograms. This may depend on whether you have a family history of breast cancer. BRCA-related cancer screening. This may be done if you have a family history of breast, ovarian, tubal, or peritoneal cancers. Bone density scan. This is done to screen for osteoporosis. Talk with your health care provider about your test results, treatment options, and if necessary, the need for more tests. Follow these instructions at home: Eating and drinking  Eat a diet that includes fresh fruits and vegetables, whole grains, lean protein, and low-fat dairy products. Take vitamin and mineral supplements as recommended by your health care provider. Do not drink alcohol if: Your health care provider tells you not to drink. You are pregnant, may be pregnant, or are planning to become pregnant. If you drink alcohol: Limit how much you have to 0-1 drink a day. Know how much alcohol is in your drink. In the [place] one drink equals one 12 oz bottle of beer (355 mL), one 5 oz glass of wine (148 mL), or one 1 oz glass of hard liquor (44 mL).  Lifestyle Brush your teeth every morning and night with fluoride toothpaste. Floss one time each day. Exercise for at least 30 minutes 5 or more days each week. Do not use any products that contain nicotine or tobacco.  These products include cigarettes, chewing tobacco, and vaping devices, such as e-cigarettes. If you need help quitting, ask your health care provider. Do not use drugs. If you are sexually active, practice safe sex. Use a condom or other form of protection to prevent STIs. If you do not wish to become pregnant, use a form of birth control. If you plan to become pregnant, see your health care provider for a prepregnancy visit. Take aspirin only as told by your health care provider. Make sure that you understand how much to take and what form to take. Work with your health care provider to find out whether it is safe and beneficial for you to take aspirin daily. Find healthy ways to manage stress, such as: Meditation, yoga, or listening to music. Journaling. Talking to a trusted person. Spending time with friends and family. Minimize exposure to UV radiation to reduce your risk of skin cancer. Safety Always wear your seat belt while driving or riding in a vehicle. Do not drive: If you have been drinking alcohol. Do not ride with someone who has been drinking. When you are tired or distracted. While texting. If you have been using any mind-altering substances or drugs. Wear a helmet and other protective equipment during sports activities. If you have firearms in your house, make sure you follow all gun safety procedures. Seek help if you have been physically or sexually abused. What's next? Visit your health care provider once a year for an annual wellness visit. Ask your health care provider how often you should have your eyes and teeth checked. Stay up to date on all vaccines. This information is not intended to replace advice given to you by your health care provider. Make sure you discuss any questions you have with your health care provider. Document Revised: 05/21/2021 Document Reviewed: 05/21/2021 Elsevier Patient Education  2024 [employer].

## 2024-04-14 NOTE — Addendum Note (Signed)
 Addended by: Serafina Damme on: 04/14/2024 08:57 PM   Modules accepted: Orders

## 2024-04-16 ENCOUNTER — Other Ambulatory Visit (HOSPITAL_COMMUNITY): Payer: Self-pay

## 2024-04-16 ENCOUNTER — Other Ambulatory Visit: Payer: Self-pay

## 2024-04-16 MED ORDER — IRON (FERROUS SULFATE) 325 (65 FE) MG PO TABS
325.0000 mg | ORAL_TABLET | Freq: Every day | ORAL | 1 refills | Status: AC
  Filled 2024-04-16: qty 30, 30d supply, fill #0

## 2024-04-16 NOTE — Addendum Note (Signed)
 Addended by: Serafina Damme on: 04/16/2024 10:16 AM   Modules accepted: Orders

## 2024-04-17 ENCOUNTER — Other Ambulatory Visit (HOSPITAL_COMMUNITY): Payer: Self-pay

## 2024-04-18 ENCOUNTER — Other Ambulatory Visit (HOSPITAL_COMMUNITY): Payer: Self-pay

## 2024-04-19 ENCOUNTER — Other Ambulatory Visit: Payer: Self-pay

## 2024-04-20 ENCOUNTER — Other Ambulatory Visit: Payer: Self-pay

## 2024-04-21 ENCOUNTER — Other Ambulatory Visit (HOSPITAL_COMMUNITY): Payer: Self-pay

## 2024-05-05 ENCOUNTER — Encounter: Payer: Self-pay | Admitting: Obstetrics & Gynecology

## 2024-05-15 ENCOUNTER — Other Ambulatory Visit (HOSPITAL_COMMUNITY): Payer: Self-pay

## 2024-06-19 ENCOUNTER — Inpatient Hospital Stay (HOSPITAL_BASED_OUTPATIENT_CLINIC_OR_DEPARTMENT_OTHER): Admission: RE | Admit: 2024-06-19 | Payer: 59 | Source: Ambulatory Visit

## 2024-07-06 ENCOUNTER — Encounter (HOSPITAL_BASED_OUTPATIENT_CLINIC_OR_DEPARTMENT_OTHER): Payer: Self-pay

## 2024-07-06 ENCOUNTER — Ambulatory Visit (HOSPITAL_BASED_OUTPATIENT_CLINIC_OR_DEPARTMENT_OTHER)
Admission: RE | Admit: 2024-07-06 | Discharge: 2024-07-06 | Disposition: A | Discharge status: Home / Self Care | Source: Ambulatory Visit | Attending: Medical | Admitting: Medical

## 2024-07-06 DIAGNOSIS — Z1231 Encounter for screening mammogram for malignant neoplasm of breast: Secondary | ICD-10-CM | POA: Diagnosis not present

## 2024-07-06 DIAGNOSIS — Z139 Encounter for screening, unspecified: Secondary | ICD-10-CM | POA: Diagnosis present

## 2024-07-10 ENCOUNTER — Ambulatory Visit: Payer: Self-pay | Admitting: Medical

## 2024-07-11 ENCOUNTER — Other Ambulatory Visit (HOSPITAL_COMMUNITY): Payer: Self-pay

## 2024-07-14 ENCOUNTER — Other Ambulatory Visit (INDEPENDENT_AMBULATORY_CARE_PROVIDER_SITE_OTHER): Payer: Self-pay

## 2024-07-14 ENCOUNTER — Ambulatory Visit: Admitting: Orthopaedic Surgery

## 2024-07-14 DIAGNOSIS — G8929 Other chronic pain: Secondary | ICD-10-CM | POA: Diagnosis not present

## 2024-07-14 DIAGNOSIS — M25562 Pain in left knee: Secondary | ICD-10-CM | POA: Diagnosis not present

## 2024-07-14 DIAGNOSIS — M2242 Chondromalacia patellae, left knee: Secondary | ICD-10-CM

## 2024-07-14 NOTE — Progress Notes (Signed)
 Office Visit Note   Patient: Kristina Sherman           Date of Birth: Apr 05, 1976           MRN: [id number] Visit Date: 07/14/2024              Requested by: Dorina Loving, PA-C [id number] [place],  [place] [address] PCP: Dorina Loving, PA-C   Assessment & Plan: Visit Diagnoses:  1. Chondromalacia of left patella     Plan: History of Present Illness A 48 year old female presents with left greater than right knee pain.  She has experienced bilateral knee pain for one to two years, progressively worsening. Pain worsens with activities like descending stairs, running, or exercising, and disrupts sleep. Walking does not cause pain, but stress on the leg exacerbates it.  Both knees are affected, with the left knee significantly worse. Swelling is observed by her husband, though she is unsure. There is no locking, catching, or giving way, and no recent injuries.  She has not changed her exercise routine but has started learning tennis. Pain does not occur during tennis but is noticeable afterward. There are no recent weight changes. Pain is present around the kneecap.  Physical Exam MUSCULOSKELETAL: No effusion in the knee. Normal flexibility in the knee. Mild tenderness across the knee.  Results RADIOLOGY Knee X-ray: Adequate joint space between the femur and tibia, indicating sufficient cartilage. Lateral view confirms adequate space. Minimal osteophytic spurring behind the patella. No significant abnormalities.  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 X-rays show good joint space, minimal arthritic spurring.  - Refer to physical therapy for gait and flexibility assessment, kinesio taping instruction. - Advise NSAIDs such as Advil, Motrin, Aleve  for pain. - Recommend Voltaren gel for topical pain relief. - Suggest knee braces as needed for support. - Educate on keeping knees  straight during sleep. - Encourage physical activity within pain limits. - Discuss kinesio taping techniques, suggest learning from physical therapy or online resources.  Follow-Up Instructions: No follow-ups on file.   Orders:  Orders Placed This Encounter  Procedures   XR KNEE 3 VIEW LEFT   Ambulatory referral to Physical Therapy   No orders of the defined types were placed in this encounter.     Procedures: No procedures performed   Clinical Data: No additional findings.   Subjective: Chief Complaint  Patient presents with   Left Knee - Pain    HPI  Review of Systems  Constitutional: Negative.   HENT: Negative.    Eyes: Negative.   Respiratory: Negative.    Cardiovascular: Negative.   Endocrine: Negative.   Musculoskeletal: Negative.   Neurological: Negative.   Hematological: Negative.   Psychiatric/Behavioral: Negative.    All other systems reviewed and are negative.    Objective: Vital Signs: There were no vitals taken for this visit.  Physical Exam Vitals and nursing note reviewed.  Constitutional:      Appearance: She is well-developed.  HENT:     Head: Atraumatic.     Nose: Nose normal.  Eyes:     Extraocular Movements: Extraocular movements intact.  Cardiovascular:     Pulses: Normal pulses.  Pulmonary:     Effort: Pulmonary effort is normal.  Abdominal:     Palpations: Abdomen is soft.  Musculoskeletal:     Cervical back: Neck supple.  Skin:    General: Skin is warm.     Capillary  Refill: Capillary refill takes less than 2 seconds.  Neurological:     Mental Status: She is alert. Mental status is at baseline.  Psychiatric:        Behavior: Behavior normal.        Thought Content: Thought content normal.        Judgment: Judgment normal.     Ortho Exam  Specialty Comments:  No specialty comments available.  Imaging: XR KNEE 3 VIEW LEFT Result Date: 07/14/2024 X-rays of the left knee show no acute or structural  abnormalities    PMFS History: Patient Active Problem List   Diagnosis Date Noted   Chondromalacia of left patella 07/14/2024   Vitamin D  deficiency 01/18/2019   Migraine without status migrainosus, not intractable 06/23/2018   Acquired hypothyroidism 03/02/2018   Perennial allergic rhinitis with seasonal variation 03/02/2018   Migraine without aura and without status migrainosus, not intractable 05/11/2016   Past Medical History:  Diagnosis Date   Migraines    Thyroid  disease     Family History  Problem Relation Age of Onset   Hypertension Mother    Diabetes Father     Past Surgical History:  Procedure Laterality Date   CESAREAN SECTION     Social History   Occupational History   Occupation: uemployed  Tobacco Use   Smoking status: Never   Smokeless tobacco: Never  Vaping Use   Vaping status: Never Used  Substance and Sexual Activity   Alcohol use: Never   Drug use: Never   Sexual activity: Yes    Birth control/protection: I.U.D.

## 2024-10-09 ENCOUNTER — Encounter: Payer: Self-pay | Admitting: Radiology

## 2024-10-09 ENCOUNTER — Encounter: Payer: Self-pay | Admitting: Gastroenterology

## 2024-10-09 ENCOUNTER — Other Ambulatory Visit (HOSPITAL_COMMUNITY): Payer: Self-pay

## 2024-10-10 ENCOUNTER — Other Ambulatory Visit (HOSPITAL_COMMUNITY): Payer: Self-pay

## 2024-10-10 ENCOUNTER — Other Ambulatory Visit: Payer: Self-pay

## 2024-11-01 DIAGNOSIS — H52223 Regular astigmatism, bilateral: Secondary | ICD-10-CM | POA: Diagnosis not present

## 2024-11-01 DIAGNOSIS — H5213 Myopia, bilateral: Secondary | ICD-10-CM | POA: Diagnosis not present

## 2024-11-01 DIAGNOSIS — H524 Presbyopia: Secondary | ICD-10-CM | POA: Diagnosis not present

## 2024-11-13 ENCOUNTER — Telehealth: Payer: Self-pay

## 2024-11-13 NOTE — Telephone Encounter (Signed)
 LVM informing patient due to delay in opening that video previsit would be rescheduled to 10:30 AM.   [facility] number left if she needed to cancel or reschedule.

## 2024-11-14 ENCOUNTER — Encounter

## 2024-11-15 ENCOUNTER — Ambulatory Visit

## 2024-11-15 ENCOUNTER — Other Ambulatory Visit (HOSPITAL_COMMUNITY): Payer: Self-pay

## 2024-11-15 VITALS — Ht 62.0 in | Wt 121.0 lb

## 2024-11-15 DIAGNOSIS — Z1211 Encounter for screening for malignant neoplasm of colon: Secondary | ICD-10-CM

## 2024-11-15 MED ORDER — NA SULFATE-K SULFATE-MG SULF 17.5-3.13-1.6 GM/177ML PO SOLN
1.0000 | Freq: Once | ORAL | 0 refills | Status: AC
  Filled 2024-11-15 – 2024-11-27 (×2): qty 354, 2d supply, fill #0

## 2024-11-15 NOTE — Progress Notes (Signed)
 No egg or soy allergy known to patient  No issues known to pt with past sedation with any surgeries or procedures Patient denies ever being told they had issues or difficulty with intubation  No FH of Malignant Hyperthermia Pt is not on diet pills Pt is not on  home 02  Pt is not on blood thinners  Pt denies issues with constipation  No A fib or A flutter Have any cardiac testing pending--No Pt can ambulate  Pt denies use of chewing tobacco Discussed diabetic I weight loss medication holds Discussed NSAID holds Checked BMI Pt instructed to use Single[employer]com or GoodRx for a price reduction on prep  Patient's chart reviewed.  Pre visit completed

## 2024-11-20 ENCOUNTER — Encounter

## 2024-11-22 ENCOUNTER — Encounter: Payer: Self-pay | Admitting: Gastroenterology

## 2024-11-24 ENCOUNTER — Other Ambulatory Visit (HOSPITAL_COMMUNITY): Payer: Self-pay

## 2024-11-27 ENCOUNTER — Other Ambulatory Visit (HOSPITAL_COMMUNITY): Payer: Self-pay

## 2024-11-28 ENCOUNTER — Encounter: Payer: Self-pay | Admitting: Gastroenterology

## 2024-11-28 ENCOUNTER — Ambulatory Visit: Admitting: Gastroenterology

## 2024-11-28 VITALS — BP 120/80 | HR 72 | Temp 97.8°F | Resp 14 | Ht 62.0 in | Wt 121.0 lb

## 2024-11-28 DIAGNOSIS — K648 Other hemorrhoids: Secondary | ICD-10-CM

## 2024-11-28 DIAGNOSIS — K644 Residual hemorrhoidal skin tags: Secondary | ICD-10-CM | POA: Diagnosis not present

## 2024-11-28 DIAGNOSIS — Z1211 Encounter for screening for malignant neoplasm of colon: Secondary | ICD-10-CM

## 2024-11-28 DIAGNOSIS — K5140 Inflammatory polyps of colon without complications: Secondary | ICD-10-CM | POA: Diagnosis not present

## 2024-11-28 DIAGNOSIS — D123 Benign neoplasm of transverse colon: Secondary | ICD-10-CM

## 2024-11-28 MED ORDER — SODIUM CHLORIDE 0.9 % IV SOLN: 500.0000 mL | INTRAVENOUS | Status: DC

## 2024-11-28 NOTE — Progress Notes (Signed)
 Pt's states no medical or surgical changes since previsit or office visit.

## 2024-11-28 NOTE — Progress Notes (Signed)
 Report to PACU, RN, vss, BBS= Clear.

## 2024-11-28 NOTE — Progress Notes (Signed)
 Called to room to assist during endoscopic procedure.  Patient ID and intended procedure confirmed with present staff. Received instructions for my participation in the procedure from the performing physician.

## 2024-11-28 NOTE — Op Note (Signed)
 [facility] Patient Name: Kristina Sherman Procedure Date: 11/28/2024 8:10 AM MRN: [id number] Endoscopist: Gustav ALONSO Mcgee , MD, [id number] Age: 48 Referring MD:  Date of Birth: Apr 06, 1976 Gender: Female Account #: [id number] Procedure:                Colonoscopy Indications:              Screening for colorectal malignant neoplasm Medicines:                Monitored Anesthesia Care Procedure:                Pre-Anesthesia Assessment:                           - Prior to the procedure, a History and Physical                            was performed, and patient medications and                            allergies were reviewed. The patient's tolerance of                            previous anesthesia was also reviewed. The risks                            and benefits of the procedure and the sedation                            options and risks were discussed with the patient.                            All questions were answered, and informed consent                            was obtained. Prior Anticoagulants: The patient has                            taken no anticoagulant or antiplatelet agents. ASA                            Grade Assessment: II - A patient with mild systemic                            disease. After reviewing the risks and benefits,                            the patient was deemed in satisfactory condition to                            undergo the procedure.                           After obtaining informed consent, the colonoscope  was passed under direct vision. Throughout the                            procedure, the patient's blood pressure, pulse, and                            oxygen saturations were monitored continuously. The                            PCF-HQ190L Colonoscope [id number] was introduced                            through the anus and advanced to the the cecum,                            identified by  appendiceal orifice and ileocecal                            valve. The colonoscopy was performed without                            difficulty. The patient tolerated the procedure                            well. The quality of the bowel preparation was                            good. The ileocecal valve, appendiceal orifice, and                            rectum were photographed. Scope In: 8:23:43 AM Scope Out: 8:40:27 AM Scope Withdrawal Time: 0 hours 8 minutes 53 seconds  Total Procedure Duration: 0 hours 16 minutes 44 seconds  Findings:                 The perianal and digital rectal examinations were                            normal.                           A 5 mm polyp was found in the transverse colon. The                            polyp was sessile. The polyp was removed with a                            cold snare. Resection and retrieval were complete.                           Non-bleeding external and internal hemorrhoids were                            found during retroflexion. The hemorrhoids were  medium-sized. Complications:            No immediate complications. Estimated Blood Loss:     Estimated blood loss was minimal. Impression:               - One 5 mm polyp in the transverse colon, removed                            with a cold snare. Resected and retrieved.                           - Non-bleeding external and internal hemorrhoids. Recommendation:           - Patient has a contact number available for                            emergencies. The signs and symptoms of potential                            delayed complications were discussed with the                            patient. Return to normal activities tomorrow.                            Written discharge instructions were provided to the                            patient.                           - Resume previous diet.                           - Continue present  medications.                           - Await pathology results.                           - Repeat colonoscopy in 5-10 years for surveillance                            based on pathology results. Journi Moffa V. Zayed Griffie, MD 11/28/2024 8:50:05 AM This report has been signed electronically.

## 2024-11-28 NOTE — Patient Instructions (Signed)
-   Resume previous diet. - Continue present medications. - Await pathology results. - Repeat colonoscopy in 5-10 years for surveillance based on pathology results.   YOU HAD AN ENDOSCOPIC PROCEDURE TODAY AT THE [facility]:   Refer to the procedure report that was given to you for any specific questions about what was found during the examination.  If the procedure report does not answer your questions, please call your gastroenterologist to clarify.  If you requested that your care partner not be given the details of your procedure findings, then the procedure report has been included in a sealed envelope for you to review at your convenience later.  YOU SHOULD EXPECT: Some feelings of bloating in the abdomen. Passage of more gas than usual.  Walking can help get rid of the air that was put into your GI tract during the procedure and reduce the bloating. If you had a lower endoscopy (such as a colonoscopy or flexible sigmoidoscopy) you may notice spotting of blood in your stool or on the toilet paper. If you underwent a bowel prep for your procedure, you may not have a normal bowel movement for a few days.  Please Note:  You might notice some irritation and congestion in your nose or some drainage.  This is from the oxygen used during your procedure.  There is no need for concern and it should clear up in a day or so.  SYMPTOMS TO REPORT IMMEDIATELY:  Following lower endoscopy (colonoscopy or flexible sigmoidoscopy):  Excessive amounts of blood in the stool  Significant tenderness or worsening of abdominal pains  Swelling of the abdomen that is new, acute  Fever of 100F or higher  For urgent or emergent issues, a gastroenterologist can be reached at any hour by calling [phone]. Do not use MyChart messaging for urgent concerns.    DIET:  We do recommend a small meal at first, but then you may proceed to your regular diet.  Drink plenty of fluids but you should avoid  alcoholic beverages for 24 hours.  ACTIVITY:  You should plan to take it easy for the rest of today and you should NOT DRIVE or use heavy machinery until tomorrow (because of the sedation medicines used during the test).    FOLLOW UP: Our staff will call the number listed on your records the next business day following your procedure.  We will call around 7:15- 8:00 am to check on you and address any questions or concerns that you may have regarding the information given to you following your procedure. If we do not reach you, we will leave a message.     If any biopsies were taken you will be contacted by phone or by letter within the next 1-3 weeks.  Please call us at [phone] if you have not heard about the biopsies in 3 weeks.    SIGNATURES/CONFIDENTIALITY: You and/or your care partner have signed paperwork which will be entered into your electronic medical record.  These signatures attest to the fact that that the information above on your After Visit Summary has been reviewed and is understood.  Full responsibility of the confidentiality of this discharge information lies with you and/or your care-partner. 

## 2024-11-28 NOTE — Progress Notes (Signed)
 [facility] History and Physical   Primary Care Physician:  Dorina Loving, PA-C   Reason for Procedure:  Colorectal cancer screening  Plan:    Screening colonoscopy with possible interventions as needed     HPI: Kristina Sherman is a very pleasant 48 y.o. female here for screening colonoscopy. Denies any nausea, vomiting, abdominal pain, melena or bright red blood per rectum  The risks and benefits as well as alternatives of endoscopic procedure(s) have been discussed and reviewed.  The patient was provided an opportunity to ask questions and all were answered. The patient agreed with the plan and demonstrated an understanding of the instructions.   Past Medical History:  Diagnosis Date   Migraines    Thyroid  disease     Past Surgical History:  Procedure Laterality Date   CESAREAN SECTION      Prior to Admission medications  Medication Sig Start Date End Date Taking? Authorizing Provider  levothyroxine  (SYNTHROID ) 112 MCG tablet Take 1 tablet (112 mcg total) by mouth daily at 6 (six) AM. 04/14/24  Yes Saguier, Loving, PA-C  Na Sulfate-K Sulfate-Mg Sulfate concentrate (SUPREP) 17.5-3.13-1.6 GM/177ML SOLN Take 1 kit (354 mLs total) by mouth as directed. 11/15/24 11/28/24 Yes Sendy Pluta V, MD  nortriptyline  (PAMELOR ) 25 MG capsule Take 1 capsule (25 mg total) by mouth at bedtime. 01/14/24  Yes Jaffe, Adam R, DO  SUMAtriptan  (IMITREX ) 100 MG tablet TAKE 1 TABLET AT EARLIEST ONSET OF MIGRAINE. MAY REPEAT IN 2 HOURS IF HEADACHE PERSISTS OR RECURS. MAX 2 TABS/24 HRS 01/14/24 01/13/25 Yes Jaffe, Adam R, DO  Vitamin D , Ergocalciferol , (DRISDOL ) 1.25 MG (50000 UNIT) CAPS capsule Take 1 capsule (50,000 Units total) by mouth every 7 (seven) days. 03/23/23  Yes Saguier, Loving, PA-C  ibuprofen (ADVIL) 600 MG tablet Take 600 mg by mouth every 8 (eight) hours as needed. 02/22/18   [provider]  Iron , Ferrous Sulfate , 325 (65 Fe) MG TABS Take 1 tablet (325 mg total) by  mouth daily. 04/16/24   Saguier, Loving, PA-C  PARAGARD  INTRAUTERINE COPPER  IU by Intrauterine route.    [provider]    Current Outpatient Medications  Medication Sig Dispense Refill   levothyroxine  (SYNTHROID ) 112 MCG tablet Take 1 tablet (112 mcg total) by mouth daily at 6 (six) AM. 90 tablet 3   Na Sulfate-K Sulfate-Mg Sulfate concentrate (SUPREP) 17.5-3.13-1.6 GM/177ML SOLN Take 1 kit (354 mLs total) by mouth as directed. 354 mL 0   nortriptyline  (PAMELOR ) 25 MG capsule Take 1 capsule (25 mg total) by mouth at bedtime. 90 capsule 3   SUMAtriptan  (IMITREX ) 100 MG tablet TAKE 1 TABLET AT EARLIEST ONSET OF MIGRAINE. MAY REPEAT IN 2 HOURS IF HEADACHE PERSISTS OR RECURS. MAX 2 TABS/24 HRS 30 tablet 3   Vitamin D , Ergocalciferol , (DRISDOL ) 1.25 MG (50000 UNIT) CAPS capsule Take 1 capsule (50,000 Units total) by mouth every 7 (seven) days. 8 capsule 0   ibuprofen (ADVIL) 600 MG tablet Take 600 mg by mouth every 8 (eight) hours as needed.     Iron , Ferrous Sulfate , 325 (65 Fe) MG TABS Take 1 tablet (325 mg total) by mouth daily. 30 tablet 1   PARAGARD  INTRAUTERINE COPPER  IU by Intrauterine route.     Current Facility-Administered Medications  Medication Dose Route Frequency Provider Last Rate Last Admin   0.9 %  sodium chloride  infusion  500 mL Intravenous Continuous Trevin Gartrell V, MD        Allergies as of 11/28/2024   (No Known Allergies)  Family History  Problem Relation Age of Onset   Hypertension Mother    Diabetes Father    Colon cancer Neg Hx    Rectal cancer Neg Hx    Stomach cancer Neg Hx    Esophageal cancer Neg Hx     Social History   Socioeconomic History   Marital status: Married    Spouse name: Not on file   Number of children: 2   Years of education: Not on file   Highest education level: Not on file  Occupational History   Occupation: uemployed  Tobacco Use   Smoking status: Never   Smokeless tobacco: Never  Vaping Use   Vaping status:  Never Used  Substance and Sexual Activity   Alcohol use: Never   Drug use: Never   Sexual activity: Yes    Birth control/protection: I.U.D.  Other Topics Concern   Not on file  Social History Narrative   Patient is right-handed. Patient lives with her husband and 2 children in 2 story home. She drinks 3 cups of tea a day. She exercise daily, yoga and walking.   Social Drivers of Health   Tobacco Use: Low Risk (11/28/2024)   Patient History    Smoking Tobacco Use: Never    Smokeless Tobacco Use: Never    Passive Exposure: Not on file  Financial Resource Strain: Not on file  Food Insecurity: Not on file  Transportation Needs: Not on file  Physical Activity: Not on file  Stress: Not on file  Social Connections: Not on file  Intimate Partner Violence: Not on file  Depression (PHQ2-9): Low Risk (03/23/2023)   Depression (PHQ2-9)    PHQ-2 Score: 0  Alcohol Screen: Not on file  Housing: Not on file  Utilities: Not on file  Health Literacy: Not on file    Review of Systems:  All other review of systems negative except as mentioned in the HPI.  Physical Exam: Vital signs in last 24 hours: BP 106/70   Pulse (!) 108   Temp 97.8 F (36.6 C) (Temporal)   Ht 5' 2 (1.575 m)   Wt 121 lb (54.9 kg)   SpO2 100%   BMI 22.13 kg/m  General:   Alert, NAD Lungs:  Clear .   Heart:  Regular rate and rhythm Abdomen:  Soft, nontender and nondistended. Neuro/Psych:  Alert and cooperative. Normal mood and affect. A and O x 3  Reviewed labs, radiology imaging, old records and pertinent past GI work up  Patient is appropriate for planned procedure(s) and anesthesia in an ambulatory setting   K. Veena Shakerra Red , MD [phone]

## 2024-11-29 ENCOUNTER — Telehealth: Payer: Self-pay | Admitting: *Deleted

## 2024-11-29 NOTE — Telephone Encounter (Signed)
 No answer for follow up call. Left a message.

## 2024-12-04 ENCOUNTER — Encounter: Admitting: Gastroenterology

## 2024-12-05 LAB — SURGICAL PATHOLOGY

## 2024-12-21 ENCOUNTER — Ambulatory Visit: Payer: Self-pay | Admitting: Gastroenterology

## 2025-01-15 ENCOUNTER — Ambulatory Visit: Payer: 59 | Admitting: Neurology

## 2025-01-22 ENCOUNTER — Ambulatory Visit: Admitting: Neurology

## 2025-02-06 ENCOUNTER — Ambulatory Visit: Admitting: Neurology

## 2025-03-30 ENCOUNTER — Ambulatory Visit: Admitting: Orthopaedic Surgery
